# Patient Record
Sex: Female | Born: 1955 | Race: Black or African American | Hispanic: No | Marital: Married | Smoking: Never smoker
Health system: Southern US, Community
[De-identification: ages and names within clinical notes are randomized; demographics above are authoritative.]

## PROBLEM LIST (undated history)

## (undated) DIAGNOSIS — F419 Anxiety disorder, unspecified: Secondary | ICD-10-CM

## (undated) DIAGNOSIS — E785 Hyperlipidemia, unspecified: Secondary | ICD-10-CM

## (undated) DIAGNOSIS — N951 Menopausal and female climacteric states: Secondary | ICD-10-CM

## (undated) DIAGNOSIS — T8859XA Other complications of anesthesia, initial encounter: Secondary | ICD-10-CM

## (undated) DIAGNOSIS — I10 Essential (primary) hypertension: Secondary | ICD-10-CM

## (undated) DIAGNOSIS — K219 Gastro-esophageal reflux disease without esophagitis: Secondary | ICD-10-CM

## (undated) DIAGNOSIS — T4145XA Adverse effect of unspecified anesthetic, initial encounter: Secondary | ICD-10-CM

## (undated) DIAGNOSIS — M1990 Unspecified osteoarthritis, unspecified site: Secondary | ICD-10-CM

## (undated) DIAGNOSIS — Z87440 Personal history of urinary (tract) infections: Secondary | ICD-10-CM

## (undated) DIAGNOSIS — Z9889 Other specified postprocedural states: Secondary | ICD-10-CM

## (undated) DIAGNOSIS — E119 Type 2 diabetes mellitus without complications: Secondary | ICD-10-CM

## (undated) DIAGNOSIS — R112 Nausea with vomiting, unspecified: Secondary | ICD-10-CM

## (undated) HISTORY — PX: TUBAL LIGATION: SHX77

## (undated) HISTORY — PX: ROTATOR CUFF REPAIR: SHX139

## (undated) HISTORY — PX: TONSILLECTOMY: SUR1361

## (undated) HISTORY — DX: Anxiety disorder, unspecified: F41.9

## (undated) HISTORY — DX: Hyperlipidemia, unspecified: E78.5

## (undated) HISTORY — DX: Personal history of urinary (tract) infections: Z87.440

## (undated) HISTORY — PX: APPENDECTOMY: SHX54

## (undated) HISTORY — DX: Menopausal and female climacteric states: N95.1

## (undated) HISTORY — PX: CHOLECYSTECTOMY: SHX55

## (undated) HISTORY — DX: Type 2 diabetes mellitus without complications: E11.9

## (undated) HISTORY — DX: Essential (primary) hypertension: I10

## (undated) HISTORY — DX: Gastro-esophageal reflux disease without esophagitis: K21.9

---
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
## 2001-10-04 ENCOUNTER — Ambulatory Visit (HOSPITAL_COMMUNITY): Admission: RE | Admit: 2001-10-04 | Discharge: 2001-10-04 | Payer: Self-pay | Admitting: Obstetrics and Gynecology

## 2001-10-04 ENCOUNTER — Encounter: Payer: Self-pay | Admitting: Obstetrics and Gynecology

## 2001-12-02 ENCOUNTER — Emergency Department (HOSPITAL_COMMUNITY): Admission: EM | Admit: 2001-12-02 | Discharge: 2001-12-02 | Payer: Self-pay | Admitting: *Deleted

## 2001-12-02 ENCOUNTER — Encounter: Payer: Self-pay | Admitting: *Deleted

## 2002-01-25 ENCOUNTER — Encounter: Payer: Self-pay | Admitting: Urology

## 2002-01-25 ENCOUNTER — Ambulatory Visit (HOSPITAL_COMMUNITY): Admission: RE | Admit: 2002-01-25 | Discharge: 2002-01-25 | Payer: Self-pay | Admitting: Urology

## 2002-08-19 ENCOUNTER — Ambulatory Visit (HOSPITAL_COMMUNITY): Admission: RE | Admit: 2002-08-19 | Discharge: 2002-08-19 | Payer: Self-pay | Admitting: Internal Medicine

## 2002-08-19 ENCOUNTER — Encounter: Payer: Self-pay | Admitting: Internal Medicine

## 2002-10-18 ENCOUNTER — Observation Stay (HOSPITAL_COMMUNITY): Admission: RE | Admit: 2002-10-18 | Discharge: 2002-10-19 | Payer: Self-pay | Admitting: General Surgery

## 2002-11-01 ENCOUNTER — Ambulatory Visit (HOSPITAL_COMMUNITY): Admission: RE | Admit: 2002-11-01 | Discharge: 2002-11-01 | Payer: Self-pay | Admitting: Obstetrics and Gynecology

## 2002-11-01 ENCOUNTER — Encounter: Payer: Self-pay | Admitting: Obstetrics and Gynecology

## 2003-11-15 ENCOUNTER — Ambulatory Visit (HOSPITAL_COMMUNITY): Admission: RE | Admit: 2003-11-15 | Discharge: 2003-11-15 | Payer: Self-pay | Admitting: Internal Medicine

## 2004-08-14 ENCOUNTER — Ambulatory Visit (HOSPITAL_COMMUNITY): Admission: RE | Admit: 2004-08-14 | Discharge: 2004-08-14 | Payer: Self-pay | Admitting: Obstetrics and Gynecology

## 2004-11-22 ENCOUNTER — Ambulatory Visit (HOSPITAL_COMMUNITY): Admission: RE | Admit: 2004-11-22 | Discharge: 2004-11-22 | Payer: Self-pay | Admitting: Internal Medicine

## 2005-12-11 ENCOUNTER — Ambulatory Visit: Payer: Self-pay | Admitting: Internal Medicine

## 2005-12-11 ENCOUNTER — Ambulatory Visit (HOSPITAL_COMMUNITY): Admission: RE | Admit: 2005-12-11 | Discharge: 2005-12-11 | Payer: Self-pay | Admitting: Internal Medicine

## 2005-12-30 ENCOUNTER — Ambulatory Visit (HOSPITAL_COMMUNITY): Admission: RE | Admit: 2005-12-30 | Discharge: 2005-12-30 | Payer: Self-pay | Admitting: Obstetrics and Gynecology

## 2006-09-29 ENCOUNTER — Ambulatory Visit (HOSPITAL_COMMUNITY): Admission: RE | Admit: 2006-09-29 | Discharge: 2006-09-29 | Payer: Self-pay | Admitting: Internal Medicine

## 2007-01-08 ENCOUNTER — Ambulatory Visit (HOSPITAL_COMMUNITY): Admission: RE | Admit: 2007-01-08 | Discharge: 2007-01-08 | Payer: Self-pay | Admitting: Obstetrics and Gynecology

## 2007-08-23 ENCOUNTER — Other Ambulatory Visit: Admission: RE | Admit: 2007-08-23 | Discharge: 2007-08-23 | Payer: Self-pay | Admitting: Orthopaedic Surgery

## 2007-11-12 ENCOUNTER — Ambulatory Visit: Admission: RE | Admit: 2007-11-12 | Discharge: 2007-11-12 | Payer: Self-pay | Admitting: Internal Medicine

## 2007-11-25 ENCOUNTER — Ambulatory Visit: Payer: Self-pay | Admitting: Pulmonary Disease

## 2008-02-15 ENCOUNTER — Ambulatory Visit (HOSPITAL_COMMUNITY): Admission: RE | Admit: 2008-02-15 | Discharge: 2008-02-15 | Payer: Self-pay | Admitting: Obstetrics and Gynecology

## 2008-07-22 ENCOUNTER — Ambulatory Visit (HOSPITAL_COMMUNITY): Admission: RE | Admit: 2008-07-22 | Discharge: 2008-07-22 | Payer: Self-pay | Admitting: Internal Medicine

## 2008-08-31 ENCOUNTER — Other Ambulatory Visit: Admission: RE | Admit: 2008-08-31 | Discharge: 2008-08-31 | Payer: Self-pay | Admitting: Obstetrics and Gynecology

## 2009-03-14 ENCOUNTER — Ambulatory Visit (HOSPITAL_COMMUNITY): Admission: RE | Admit: 2009-03-14 | Discharge: 2009-03-14 | Payer: Self-pay | Admitting: Internal Medicine

## 2009-09-05 ENCOUNTER — Other Ambulatory Visit: Admission: RE | Admit: 2009-09-05 | Discharge: 2009-09-05 | Payer: Self-pay | Admitting: Obstetrics and Gynecology

## 2009-11-03 HISTORY — PX: FOOT SURGERY: SHX648

## 2010-03-29 ENCOUNTER — Ambulatory Visit (HOSPITAL_COMMUNITY): Admission: RE | Admit: 2010-03-29 | Discharge: 2010-03-29 | Payer: Self-pay | Admitting: Obstetrics and Gynecology

## 2010-09-16 ENCOUNTER — Other Ambulatory Visit: Admission: RE | Admit: 2010-09-16 | Discharge: 2010-09-16 | Payer: Self-pay | Admitting: Obstetrics and Gynecology

## 2011-03-18 NOTE — Procedures (Signed)
NAMEJOEE, IOVINE                 ACCOUNT NO.:  [id number]   MEDICAL RECORD NO.:  [id number]          PATIENT TYPE:  OUT   LOCATION:  SLEEP LAB                     FACILITY:  [facility]   PHYSICIAN:  Barbaraann Share, MD,FCCPDATE OF BIRTH:  11/10/54   DATE OF STUDY:  11/12/2007                            NOCTURNAL POLYSOMNOGRAM   REFERRING PHYSICIAN:  Kingsley Callander. Ouida Sills, MD   INDICATIONS:  Hypersomnia with sleep apnea.   EPWORTH SCORE:  Six.   SLEEP ARCHITECTURE:  The patient had total sleep time of 364 minutes  with mildly decreased slow wave sleep for age and mildly decreased REM.  Sleep onset latency was normal at 13 minutes and REM onset was fairly  rapid at 54 minutes.  Sleep efficiency was fairly good at 92%.   RESPIRATORY DATA:  The patient was found to have two obstructive  hypopneas and no apneas for an apnea/hypopnea index of 0.3 events per  hour.  Events were not positional but they did occur primarily during  REM.  The patient did have many more numbers of airflow reduction that  qualified for obstructive hypopnea by airflow standards, however, did  not meet the American Academy of sleep medicine standards for O2  desaturation.  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  Split night study was not done secondary to the very small  numbers of events.   OXYGEN DATA:  There was O2 desaturation as low as 92% with the patient's  obstructive events.   CARDIAC DATA:  No clinically significant arrhythmias were noted.   MOVEMENT/PARASOMNIA:  None.   IMPRESSION/RECOMMENDATIONS:  1. Small numbers of obstructive events which do not meet the      apnea/hypopnea index criteria for the obstructive sleep apnea      syndrome.  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  This is a pre sleep  apnea condition      which      can disrupt sleep and affect quality of life.  Treatment for this      can include weight loss alone if indicated, upper airway surgery,      oral appliance, and also CPAP.  Clinical correlation is suggested.      Barbaraann Share, MD,FCCP  Diplomate, American Board of Sleep  Medicine  Electronically Signed     [facility]/MEDQ  D:  11/25/2007 15:32:11  T:  11/26/2007 05:31:55  Job:  [id number]

## 2011-03-21 NOTE — Op Note (Signed)
   NAME:  Emily Reeves, Emily Reeves                           ACCOUNT NO.:  [id number]   MEDICAL RECORD NO.:  [id number]                   PATIENT TYPE:  AMB   LOCATION:  DAY                                  FACILITY:  [facility]   PHYSICIAN:  Jerolyn Shin C. Katrinka Blazing, M.D.                DATE OF BIRTH:  08-Jul-1956   DATE OF PROCEDURE:  10/18/2002  DATE OF DISCHARGE:                                 OPERATIVE REPORT   PREOPERATIVE DIAGNOSIS:  Chronic cholecystitis.   POSTOPERATIVE DIAGNOSIS:  Chronic cholecystitis.   PROCEDURE:  Laparoscopic cholecystectomy.   SURGEON:  Dirk Dress. Katrinka Blazing, M.D.   DESCRIPTION OF PROCEDURE:  Under general anesthesia, the patient's abdomen  was prepped and draped in a sterile field.  A supraumbilical incision was  made.  Veress needle was inserted uneventfully.  Abdomen was insufflated  with 2.5 L of CO2.  Using a Visiport guide, a 10 mm port was placed.  The  laparoscope was placed.  The gallbladder was visualized.  The patient was  positioned in reversed Trendelenburg position.  Under videoscopic guidance,  the 10 mm port and two 5 mm ports were placed in standard positions in the  right upper quadrant.  The gallbladder was grasped and positioned.  Chronic  adhesions to the gallbladder were taken down using electrocautery and blunt  dissection.  The cystic duct was identified and dissected back to the  gallbladder.  It was clipped with five clips and divided.  There were two  small cystic artery branches.  Each was clipped with three clips and  divided.  Using electrocautery, the gallbladder was separated from the  intrahepatic bed without difficulty.  It was grasped and retrieved intact.  Hemostasis in the bed was felt to be adequate.  There was no bleeding and no  bile leak.  The liver appeared unremarkable.  The stomach was normal.  There  were some adhesions in the lower abdomen in the midline.  CO2 was allowed to  escape from the abdomen.  The ports were removed.  The  incisions were closed  using 0 Dexon on the fascia of the larger incisions and staples on the skin.  The patient tolerated the procedure well.  She was awakened from anesthesia  uneventfully, transferred to a bed, and taken to the postanesthetic area.                                               Dirk Dress. Katrinka Blazing, M.D.    LCS/MEDQ  D:  10/18/2002  T:  10/18/2002  Job:  [id number]

## 2011-03-21 NOTE — Op Note (Signed)
NAMETRAVIS, PURK                 ACCOUNT NO.:  [id number]   MEDICAL RECORD NO.:  [id number]          PATIENT TYPE:  AMB   LOCATION:  DAY                           FACILITY:  [facility]   PHYSICIAN:  Lionel December, M.D.    DATE OF BIRTH:  06/24/1956   DATE OF PROCEDURE:  12/11/2005  DATE OF DISCHARGE:                                 OPERATIVE REPORT   PROCEDURE:  Colonoscopy.   INDICATIONS:  Emily Reeves is a 55 year old Afro-American female who is here for  screening colonoscopy. Family history is negative for colorectal carcinoma.  Procedure and risks were reviewed with the patient, and informed consent was  obtained.   MEDICINES FOR CONSCIOUS SEDATION:  Demerol 50 mg IV, Versed 6 mg IV.   FINDINGS:  Procedure performed in endoscopy suite. The patient's vital signs  and O2 saturation were monitored during the procedure and remained stable.  The patient was placed in left lateral position and rectal examination  performed. No abnormality noted on external or digital exam. Olympus  videoscope was placed in rectum and advanced under vision into sigmoid colon  and beyond. Somewhat redundant sigmoid colon, but I was able to pass the  scope into descending colon and beyond. Preparation was excellent. Scope was  passed to cecum which was identified by appendiceal stump and ileocecal  valve. Pictures taken for the record. As the scope was withdrawn, colonic  mucosa was once again carefully examined and was normal throughout. Rectal  mucosa similarly was normal. Scope was retroflexed to examine anorectal  junction which was unremarkable. Endoscope was straightened and withdrawn.  The patient tolerated the procedure well.   FINAL DIAGNOSIS:  Normal colonoscopy.   RECOMMENDATIONS:  1.  She will resume her usual diet and medications.  2.  Yearly Hemoccults. She may consider next screening exam in 10 years from      now.      Lionel December, M.D.  Electronically Signed     NR/MEDQ  D:   12/11/2005  T:  12/11/2005  Job:  [id number]   cc:   Tilda Burrow, M.D.  Fax: [phone]   Kingsley Callander. Ouida Sills, MD  Fax: [phone]

## 2011-03-21 NOTE — H&P (Signed)
   NAME:  Emily Reeves, Emily Reeves                           ACCOUNT NO.:  [id number]   MEDICAL RECORD NO.:  [id number]                   PATIENT TYPE:  AMB   LOCATION:  DAY                                  FACILITY:  [facility]   PHYSICIAN:  Jerolyn Shin C. Katrinka Blazing, M.D.                DATE OF BIRTH:  05-25-1956   DATE OF ADMISSION:  10/18/2002  DATE OF DISCHARGE:                                HISTORY & PHYSICAL   HISTORY OF PRESENT ILLNESS:  A 55 year old female with a two-month history  of recurrent abdominal pain.  Symptoms were made worse with meals.  She had  intolerance to greasy, fried, and spicy foods.  Ultrasound was negative x2,  but HIDA scan was positive with an ejection fraction of less than 20%.  This  was done in 2000.  She has continued midepigastric pain.  The pain radiates  into her back.  There has been no history to suggest biliary obstruction.  She is scheduled for laparoscopic cholecystectomy.   PAST MEDICAL HISTORY:  She has no other chronic medical illness.   MEDICATIONS:  1. Protonix 40 mg q.d.  2. Xanax 0.5 mg p.r.n. during her menses.   PAST SURGICAL HISTORY:  Appendectomy, C-section, and tubal ligation.   SOCIAL HISTORY:  She does not drink, smoke, or use drugs.  She is married  with three children and is employed.   FAMILY HISTORY:  Positive for hypertension, atherosclerotic heart disease,  congestive heart failure, irritable bowel syndrome, breast cancer, and lung  cancer.   REVIEW OF SYSTEMS:  Negative.   PHYSICAL EXAMINATION:  VITAL SIGNS:  Blood pressure 114/78, pulse 84,  respirations 20, weight 187 pounds.  HEENT:  Unremarkable.  NECK:  Supple without JVD or bruit.  CHEST:  Clear to auscultation.  CARDIAC:  Regular rate and rhythm without murmur, gallop, or rub.  ABDOMEN:  Epigastric and right upper quadrant tenderness, normal bowel  sounds, no masses.  EXTREMITIES:  No cyanosis, clubbing, or edema.  NEUROLOGIC:  Nonfocal.  No motor, sensory, or cerebellar  deficit.    IMPRESSION:  Chronic cholecystitis with severe biliary colic.   PLAN:  Laparoscopic cholecystectomy.                                               Dirk Dress. Katrinka Blazing, M.D.    LCS/MEDQ  D:  10/18/2002  T:  10/18/2002  Job:  [id number]

## 2011-03-26 ENCOUNTER — Other Ambulatory Visit: Payer: Self-pay | Admitting: Obstetrics and Gynecology

## 2011-03-26 DIAGNOSIS — Z139 Encounter for screening, unspecified: Secondary | ICD-10-CM

## 2011-04-03 ENCOUNTER — Ambulatory Visit (HOSPITAL_COMMUNITY)
Admission: RE | Admit: 2011-04-03 | Discharge: 2011-04-03 | Disposition: A | Discharge status: Home / Self Care | Payer: BC Managed Care – PPO | Source: Ambulatory Visit | Attending: Obstetrics and Gynecology | Admitting: Obstetrics and Gynecology

## 2011-04-03 DIAGNOSIS — Z139 Encounter for screening, unspecified: Secondary | ICD-10-CM

## 2011-04-03 DIAGNOSIS — Z1231 Encounter for screening mammogram for malignant neoplasm of breast: Secondary | ICD-10-CM | POA: Insufficient documentation

## 2011-09-24 ENCOUNTER — Other Ambulatory Visit: Payer: Self-pay | Admitting: Obstetrics and Gynecology

## 2011-09-24 ENCOUNTER — Other Ambulatory Visit (HOSPITAL_COMMUNITY)
Admission: RE | Admit: 2011-09-24 | Discharge: 2011-09-24 | Disposition: A | Discharge status: Home / Self Care | Payer: BC Managed Care – PPO | Source: Ambulatory Visit | Attending: Obstetrics and Gynecology | Admitting: Obstetrics and Gynecology

## 2011-09-24 DIAGNOSIS — Z01419 Encounter for gynecological examination (general) (routine) without abnormal findings: Secondary | ICD-10-CM | POA: Insufficient documentation

## 2012-01-01 ENCOUNTER — Telehealth (INDEPENDENT_AMBULATORY_CARE_PROVIDER_SITE_OTHER): Payer: Self-pay | Admitting: *Deleted

## 2012-01-01 DIAGNOSIS — K219 Gastro-esophageal reflux disease without esophagitis: Secondary | ICD-10-CM

## 2012-01-01 MED ORDER — ESOMEPRAZOLE MAGNESIUM 40 MG PO CPDR: 40.0000 mg | DELAYED_RELEASE_CAPSULE | Freq: Every day | ORAL | Status: DC

## 2012-01-01 NOTE — Telephone Encounter (Signed)
Patient call and said Dr. Karilyn Cota sent in a rx for Nexium 40 mg to [facility]. As the end of Dec., Emily Reeves's insurance no longer does business with them. She is needing a new rx called in or sent to [facility] [place]. Patient's return phone number is [phone].

## 2012-01-01 NOTE — Telephone Encounter (Signed)
Will send rx for [facility] to [facility]

## 2012-01-08 ENCOUNTER — Other Ambulatory Visit (INDEPENDENT_AMBULATORY_CARE_PROVIDER_SITE_OTHER): Payer: Self-pay | Admitting: *Deleted

## 2012-01-08 NOTE — Telephone Encounter (Signed)
Patient called office this morning and stated that she could not get her Nexium because a prior auth was needed. I called the [facility] did PA over the phone and per the Susie the representative Nexium 40 mg take 1 a day was approved for 24 months. The local [facility] was made aware as was the patient.

## 2012-01-28 ENCOUNTER — Encounter (INDEPENDENT_AMBULATORY_CARE_PROVIDER_SITE_OTHER): Payer: Self-pay

## 2012-03-09 ENCOUNTER — Other Ambulatory Visit (INDEPENDENT_AMBULATORY_CARE_PROVIDER_SITE_OTHER): Payer: Self-pay | Admitting: Internal Medicine

## 2012-04-13 ENCOUNTER — Other Ambulatory Visit (HOSPITAL_COMMUNITY): Payer: Self-pay | Admitting: Internal Medicine

## 2012-04-13 DIAGNOSIS — IMO0001 Reserved for inherently not codable concepts without codable children: Secondary | ICD-10-CM

## 2012-04-26 ENCOUNTER — Ambulatory Visit (HOSPITAL_COMMUNITY)
Admission: RE | Admit: 2012-04-26 | Discharge: 2012-04-26 | Disposition: A | Discharge status: Home / Self Care | Payer: BC Managed Care – PPO | Source: Ambulatory Visit | Attending: Internal Medicine | Admitting: Internal Medicine

## 2012-04-26 DIAGNOSIS — Z1231 Encounter for screening mammogram for malignant neoplasm of breast: Secondary | ICD-10-CM | POA: Insufficient documentation

## 2012-04-26 DIAGNOSIS — IMO0001 Reserved for inherently not codable concepts without codable children: Secondary | ICD-10-CM

## 2013-01-26 ENCOUNTER — Encounter: Payer: Self-pay | Admitting: *Deleted

## 2013-01-27 ENCOUNTER — Other Ambulatory Visit: Payer: Self-pay | Admitting: Obstetrics and Gynecology

## 2013-02-21 ENCOUNTER — Encounter: Payer: Self-pay | Admitting: Obstetrics and Gynecology

## 2013-02-21 ENCOUNTER — Ambulatory Visit (INDEPENDENT_AMBULATORY_CARE_PROVIDER_SITE_OTHER): Payer: BC Managed Care – PPO | Admitting: Obstetrics and Gynecology

## 2013-02-21 ENCOUNTER — Encounter: Payer: Self-pay | Admitting: *Deleted

## 2013-02-21 ENCOUNTER — Other Ambulatory Visit (HOSPITAL_COMMUNITY)
Admission: RE | Admit: 2013-02-21 | Discharge: 2013-02-21 | Disposition: A | Discharge status: Home / Self Care | Payer: BC Managed Care – PPO | Source: Ambulatory Visit | Attending: Obstetrics and Gynecology | Admitting: Obstetrics and Gynecology

## 2013-02-21 VITALS — BP 118/70 | Ht 69.5 in | Wt 213.1 lb

## 2013-02-21 DIAGNOSIS — Z01419 Encounter for gynecological examination (general) (routine) without abnormal findings: Secondary | ICD-10-CM

## 2013-02-21 DIAGNOSIS — Z780 Asymptomatic menopausal state: Secondary | ICD-10-CM | POA: Insufficient documentation

## 2013-02-21 DIAGNOSIS — Z1212 Encounter for screening for malignant neoplasm of rectum: Secondary | ICD-10-CM

## 2013-02-21 DIAGNOSIS — Z0000 Encounter for general adult medical examination without abnormal findings: Secondary | ICD-10-CM

## 2013-02-21 DIAGNOSIS — Z1151 Encounter for screening for human papillomavirus (HPV): Secondary | ICD-10-CM | POA: Insufficient documentation

## 2013-02-21 LAB — HEMOCCULT GUIAC POC 1CARD (OFFICE)

## 2013-02-21 NOTE — Patient Instructions (Signed)
Notify us if breast mass or discharge noted, topical hydrocortisone as needed

## 2013-02-21 NOTE — Progress Notes (Signed)
  Assessment:  Normal Gyn Exam, postmenopausal on no HT   Plan:  1. Mammogram annual 2. pap smear done, next pap due 2017 3. return annually or prn  Subjective:  Emily Reeves is a 57 y.o. female  No obstetric history on file. who presents for annual exam.  The patient has complaints today of breast itching, and bilateral symmetric soreness, no dischg or mass  The following portions of the patient's history were reviewed and updated as appropriate: allergies, current medications, past family history, past medical history, past social history, past surgical history and problem list.  Review of Systems Pertinent items are noted in HPI.  Objective:  BP 118/70  Ht 5' 9.5" (1.765 m)  Wt 213 lb 1.6 oz (96.662 kg)  BMI 31.03 kg/m2  BMI: Body mass index is 31.03 kg/(m^2). General Appearance: Alert, appropriate appearance for age. No acute distress HEENT: Grossly normal Neck / Thyroid:  Cardiovascular: RRR; normal S1, S2, no murmur Lungs: CTA bilaterally Back: No CVAT Breast Exam: No dimpling, nipple retraction or discharge. No masses or nodes., Normal breast tissue bilaterally and No masses or nodes.No dimpling, nipple retraction or discharge. Gastrointestinal: Soft, non-tender, no masses or organomegaly Pelvic Exam: External genitalia: normal general appearance Vaginal: normal mucosa without prolapse or lesions Cervix: normal appearance Adnexa: normal bimanual exam Uterus: retroverted Rectovaginal: normal rectal, no masses and no rectocele Lymphatic Exam: Non-palpable nodes in neck, clavicular, axillary, or inguinal regions Skin: no rash or abnormalities Neurologic: Normal gait and speech, no tremor  Psychiatric: Alert and oriented, appropriate affect.  Urinalysis:normal and Not done  Christin Bach. MD Pgr [phone] 11:08 AM

## 2013-03-11 ENCOUNTER — Other Ambulatory Visit: Payer: Self-pay | Admitting: *Deleted

## 2013-03-14 MED ORDER — ALPRAZOLAM 0.5 MG PO TABS: 0.5000 mg | ORAL_TABLET | ORAL | Status: DC | PRN

## 2013-06-28 ENCOUNTER — Other Ambulatory Visit: Payer: Self-pay | Admitting: Obstetrics and Gynecology

## 2013-06-28 DIAGNOSIS — Z139 Encounter for screening, unspecified: Secondary | ICD-10-CM

## 2013-07-11 ENCOUNTER — Ambulatory Visit (HOSPITAL_COMMUNITY)
Admission: RE | Admit: 2013-07-11 | Discharge: 2013-07-11 | Disposition: A | Discharge status: Home / Self Care | Payer: BC Managed Care – PPO | Source: Ambulatory Visit | Attending: Obstetrics and Gynecology | Admitting: Obstetrics and Gynecology

## 2013-07-11 DIAGNOSIS — Z139 Encounter for screening, unspecified: Secondary | ICD-10-CM

## 2013-07-11 DIAGNOSIS — Z1231 Encounter for screening mammogram for malignant neoplasm of breast: Secondary | ICD-10-CM | POA: Insufficient documentation

## 2013-10-22 ENCOUNTER — Other Ambulatory Visit: Payer: Self-pay | Admitting: Obstetrics and Gynecology

## 2013-11-07 ENCOUNTER — Other Ambulatory Visit: Payer: Self-pay | Admitting: Obstetrics and Gynecology

## 2013-11-07 NOTE — Telephone Encounter (Signed)
rx xanax 0.5 mg x 30 tabs, refil x 2

## 2014-05-04 ENCOUNTER — Other Ambulatory Visit: Payer: Self-pay | Admitting: Obstetrics and Gynecology

## 2014-05-04 DIAGNOSIS — Z1231 Encounter for screening mammogram for malignant neoplasm of breast: Secondary | ICD-10-CM

## 2014-05-10 ENCOUNTER — Other Ambulatory Visit: Payer: Self-pay | Admitting: Obstetrics and Gynecology

## 2014-05-10 ENCOUNTER — Ambulatory Visit (INDEPENDENT_AMBULATORY_CARE_PROVIDER_SITE_OTHER): Payer: BC Managed Care – PPO | Admitting: Obstetrics and Gynecology

## 2014-05-10 ENCOUNTER — Encounter: Payer: Self-pay | Admitting: Obstetrics and Gynecology

## 2014-05-10 VITALS — BP 120/70 | Ht 70.0 in | Wt 214.0 lb

## 2014-05-10 DIAGNOSIS — Z01419 Encounter for gynecological examination (general) (routine) without abnormal findings: Secondary | ICD-10-CM | POA: Insufficient documentation

## 2014-05-10 DIAGNOSIS — E669 Obesity, unspecified: Secondary | ICD-10-CM | POA: Insufficient documentation

## 2014-05-10 DIAGNOSIS — N889 Noninflammatory disorder of cervix uteri, unspecified: Secondary | ICD-10-CM | POA: Insufficient documentation

## 2014-05-10 DIAGNOSIS — N951 Menopausal and female climacteric states: Secondary | ICD-10-CM | POA: Insufficient documentation

## 2014-05-10 MED ORDER — CONJ ESTROGENS-BAZEDOXIFENE 0.45-20 MG PO TABS: 1.0000 | ORAL_TABLET | Freq: Every day | ORAL | Status: DC

## 2014-05-10 NOTE — Patient Instructions (Signed)
Sign up MyChart for results

## 2014-05-10 NOTE — Addendum Note (Signed)
Addended by: Farley Ly on: 05/10/2014 03:52 PM   Modules accepted: Orders

## 2014-05-10 NOTE — Progress Notes (Signed)
This chart was scribed by Ludger Nutting, Medical Scribe, for Dr. Mallory Shirk on 05/10/14 at 2:33 PM. This chart was reviewed by Dr. Mallory Shirk for accuracy.  Assessment:  Annual Gyn Exam Cervical biopsy    Plan:  1. pap smear done, next pap due 3 years 2. return in 3 months for cervical lesion follow up reexam 3    Annual mammogram advised Subjective:  Emily Reeves is a 58 y.o. female No obstetric history on file. who presents for annual exam. No LMP recorded. Patient is postmenopausal. The patient has complaints of menopausal symptoms today. Her last pap was 02/2013 and was negative.   The following portions of the patient's history were reviewed and updated as appropriate: allergies, current medications, past family history, past medical history, past social history, past surgical history and problem list.  Review of Systems Constitutional: negative Gastrointestinal: negative Genitourinary: negative  Objective:  BP 120/70  Ht 5\' 10"  (1.778 m)  Wt 214 lb (97.07 kg)  BMI 30.71 kg/m2   BMI: Body mass index is 30.71 kg/(m^2).  General Appearance: Alert, appropriate appearance for age. No acute distress HEENT: Grossly normal Neck / Thyroid:  Cardiovascular: RRR; normal S1, S2, no murmur Lungs: CTA bilaterally Back: No CVAT Breast Exam: No dimpling, nipple retraction or discharge. No masses or nodes., Normal to inspection, Normal breast tissue bilaterally and No masses or nodes.No dimpling, nipple retraction or discharge. Gastrointestinal: Soft, non-tender, no masses or organomegaly Pelvic Exam:  External genitalia: normal general appearance Vaginal: normal mucosa without prolapse or lesions, normal without tenderness, induration or masses and normal rugae Cervix: 1 cm x 1 cm erythematous protrusion from post lip of cervix probably representing nabothian cyst. Adnexa: normal bimanual exam Uterus: normal single, nontender Rectovaginal: normal rectal, no masses and guaiac negative  stool obtained  Lymphatic Exam: Non-palpable nodes in neck, clavicular, axillary, or inguinal regions Skin: no rash or abnormalities Neurologic: Normal gait and speech, no tremor  Psychiatric: Alert and oriented, appropriate affect.  Urinalysis:Not done  Cervical Biopsy Procedure Note:  Patient given informed consent, signed copy in the chart, time out was performed.  Placed in lithotomy position. Cervix viewed with speculum   Cervical biopsies obtained at posterior lip of cervix at 6 o'clock position. All specimens were labelled and sent to pathology.  Patient was given post procedure instructions. Will follow up pathology and manage accordingly.  Routine preventative health maintenance measures emphasized.   Mallory Shirk. MD Pgr [phone] 2:33 PM

## 2014-05-12 ENCOUNTER — Telehealth: Payer: Self-pay | Admitting: Obstetrics and Gynecology

## 2014-05-12 ENCOUNTER — Other Ambulatory Visit: Payer: Self-pay | Admitting: *Deleted

## 2014-05-12 DIAGNOSIS — Z01419 Encounter for gynecological examination (general) (routine) without abnormal findings: Secondary | ICD-10-CM

## 2014-05-12 NOTE — Telephone Encounter (Signed)
Left msg that biopsy was normal.

## 2014-07-14 ENCOUNTER — Ambulatory Visit (HOSPITAL_COMMUNITY): Payer: BC Managed Care – PPO

## 2014-07-28 ENCOUNTER — Ambulatory Visit (HOSPITAL_COMMUNITY)
Admission: RE | Admit: 2014-07-28 | Discharge: 2014-07-28 | Disposition: A | Discharge status: Home / Self Care | Payer: BC Managed Care – PPO | Source: Ambulatory Visit | Attending: Obstetrics and Gynecology | Admitting: Obstetrics and Gynecology

## 2014-07-28 DIAGNOSIS — Z1231 Encounter for screening mammogram for malignant neoplasm of breast: Secondary | ICD-10-CM | POA: Diagnosis present

## 2014-08-02 ENCOUNTER — Other Ambulatory Visit: Payer: Self-pay | Admitting: Obstetrics and Gynecology

## 2014-08-07 ENCOUNTER — Telehealth: Payer: Self-pay | Admitting: Obstetrics and Gynecology

## 2014-08-07 ENCOUNTER — Other Ambulatory Visit: Payer: Self-pay | Admitting: Obstetrics and Gynecology

## 2014-08-07 ENCOUNTER — Telehealth: Payer: Self-pay | Admitting: *Deleted

## 2014-08-07 MED ORDER — ALPRAZOLAM 0.5 MG PO TABS: ORAL_TABLET | ORAL | Status: DC

## 2014-08-07 MED ORDER — METRONIDAZOLE 500 MG PO TABS: 500.0000 mg | ORAL_TABLET | Freq: Two times a day (BID) | ORAL | Status: DC

## 2014-08-07 NOTE — Telephone Encounter (Signed)
Rx's sent to pharmacy.  

## 2014-08-07 NOTE — Telephone Encounter (Signed)
Ferg sent Rx's to pt's pharmacy.

## 2014-08-08 NOTE — Telephone Encounter (Signed)
Pt aware that Dr. Glo Herring sent Rx's to pharmacy.

## 2014-08-09 ENCOUNTER — Other Ambulatory Visit: Payer: Self-pay | Admitting: Obstetrics and Gynecology

## 2014-08-11 NOTE — Telephone Encounter (Signed)
Left msg that Rx is at pharmacy from earlier this week.

## 2014-08-17 ENCOUNTER — Ambulatory Visit: Payer: BC Managed Care – PPO | Admitting: Obstetrics and Gynecology

## 2014-08-18 ENCOUNTER — Ambulatory Visit: Payer: BC Managed Care – PPO | Admitting: Obstetrics and Gynecology

## 2014-08-21 ENCOUNTER — Ambulatory Visit (INDEPENDENT_AMBULATORY_CARE_PROVIDER_SITE_OTHER): Payer: BC Managed Care – PPO | Admitting: Obstetrics and Gynecology

## 2014-08-21 ENCOUNTER — Encounter: Payer: Self-pay | Admitting: Obstetrics and Gynecology

## 2014-08-21 VITALS — BP 130/74 | Ht 70.0 in | Wt 213.0 lb

## 2014-08-21 DIAGNOSIS — Z0189 Encounter for other specified special examinations: Secondary | ICD-10-CM

## 2014-08-21 DIAGNOSIS — N952 Postmenopausal atrophic vaginitis: Secondary | ICD-10-CM

## 2014-08-21 DIAGNOSIS — Z13220 Encounter for screening for lipoid disorders: Secondary | ICD-10-CM

## 2014-08-21 DIAGNOSIS — Z1329 Encounter for screening for other suspected endocrine disorder: Secondary | ICD-10-CM

## 2014-08-21 LAB — CBC
HEMATOCRIT: 37 % (ref 36.0–46.0)
Hemoglobin: 12.3 g/dL (ref 12.0–15.0)
MCH: 25.8 pg — ABNORMAL LOW (ref 26.0–34.0)
MCHC: 33.2 g/dL (ref 30.0–36.0)
MCV: 77.7 fL — ABNORMAL LOW (ref 78.0–100.0)
Platelets: 280 10*3/uL (ref 150–400)
RBC: 4.76 MIL/uL (ref 3.87–5.11)
RDW: 14.1 % (ref 11.5–15.5)
WBC: 7.3 10*3/uL (ref 4.0–10.5)

## 2014-08-21 LAB — LIPID PANEL
CHOLESTEROL: 178 mg/dL (ref 0–200)
HDL: 44 mg/dL (ref >39)
LDL Cholesterol: 115 mg/dL — ABNORMAL HIGH (ref 0–99)
Total CHOL/HDL Ratio: 4 Ratio
Triglycerides: 95 mg/dL (ref <150)
VLDL: 19 mg/dL (ref 0–40)

## 2014-08-21 LAB — COMPREHENSIVE METABOLIC PANEL
ALBUMIN: 4.1 g/dL (ref 3.5–5.2)
ALT: 30 U/L (ref 0–35)
AST: 28 U/L (ref 0–37)
Alkaline Phosphatase: 74 U/L (ref 39–117)
BUN: 12 mg/dL (ref 6–23)
CALCIUM: 9.8 mg/dL (ref 8.4–10.5)
CHLORIDE: 104 meq/L (ref 96–112)
CO2: 27 mEq/L (ref 19–32)
Creat: 0.88 mg/dL (ref 0.50–1.10)
Glucose, Bld: 116 mg/dL — ABNORMAL HIGH (ref 70–99)
POTASSIUM: 4.1 meq/L (ref 3.5–5.3)
Sodium: 141 mEq/L (ref 135–145)
TOTAL PROTEIN: 6.9 g/dL (ref 6.0–8.3)
Total Bilirubin: 0.4 mg/dL (ref 0.2–1.2)

## 2014-08-21 LAB — TSH: TSH: 1.645 u[IU]/mL (ref 0.350–4.500)

## 2014-08-21 MED ORDER — ESTROGENS, CONJUGATED 0.625 MG/GM VA CREA: 0.5000 g | TOPICAL_CREAM | Freq: Every day | VAGINAL | Status: DC

## 2014-08-21 NOTE — Progress Notes (Addendum)
Patient ID: RIAH KEHOE, female   DOB: 1955/11/04, 58 y.o.   MRN: [id number]   [facility] Visit  Patient name: NILAYA BOUIE MRN [id number]  Date of birth: 03-15-1956  CC & HPI:  ALEISHA PAONE is a 58 y.o. female presenting today for a follow-up exam after being diagnosed with a benign nabotihan cyst.  She has been complaining of increased vaginal dryness and itching.  She stopped taking Duavee after experiencing cramping and increased vaginal moisture.    ROS:  All systems have been reviewed and are negative unless otherwise specified in the HPI.   Pertinent History Reviewed:   Reviewed: Significant for  Medical         Past Medical History  Diagnosis Date   Anxiety    Perimenopausal vasomotor symptoms    History of UTI    Hypertension    GERD (gastroesophageal reflux disease)                               Surgical Hx:    Past Surgical History  Procedure Laterality Date   Cesarean section     Foot surgery  2011   Lap choley     Tubal ligation     Appendectomy     Esophagogastroduodenoscopy     Cholecystectomy     Tonsilectomy, adenoidectomy, bilateral myringotomy and tubes     Medications: Reviewed & Updated - see associated section                      Current outpatient prescriptions:ALPRAZolam (XANAX) 0.5 MG tablet, TAKE 1 TABLET BY MOUTH DAILY AS NEEDED FOR ANXIETY., Disp: 30 tablet, Rfl: 1;  diclofenac (CATAFLAM) 50 MG tablet, , Disp: , Rfl: ;  losartan-hydrochlorothiazide (HYZAAR) 50-12.5 MG per tablet, , Disp: , Rfl: ;  NEXIUM 40 MG capsule, TAKE ONE CAPSULE BY MOUTH EVERY DAY, Disp: 30 capsule, Rfl: 1 Conj Estrogens-Bazedoxifene (DUAVEE) 0.45-20 MG TABS, Take 1 tablet by mouth daily., Disp: 30 tablet, Rfl: 12   Social History: Reviewed -  reports that she has never smoked. She has never used smokeless tobacco.  Objective Findings:  Vitals: Blood pressure 130/74, height 5\' 10"  (1.778 m), weight 213 lb (96.616 kg).  Physical Examination:  General appearance - alert, well appearing, and in no distress, oriented to person, place, and time and normal appearing weight Pelvic -  VULVA: normal appearing vulva with no masses, tenderness or lesions,  VAGINA: normal appearing vagina with normal color and discharge, no lesions,  CERVIX: normal appearing cervix without discharge or lesions, atrophic, posterior lip has a 1 cm healed biopsy site UTERUS: uterus is normal size, shape, consistency and nontender, well-supported ADNEXA: normal adnexa in size, nontender and no masses   Assessment & Plan:   A:  1. Biopsy proven nabothian cyst 2/ vaginal dryness, s/p trial Duavee d/c'd 3. Annual labs ordered. Cbc/cmp/tsh/lipids P:  1. Premarin 0.5g pv 3x/wk  2 Pt to follow up by myChart prn.  This chart was scribed for Jonnie Kind, MD by Donato Schultz, ED Scribe. This patient was seen in Room 2 and the patient's care was started at 9:04 AM.  Reviewed and premarin VC to be ordered

## 2014-08-21 NOTE — Progress Notes (Signed)
Patient ID: Emily Reeves, female   DOB: May 16, 1956, 58 y.o.   MRN: [id number] Pt here today for follow up. Pt states that she has noticed that she has had more  Vaginal dryness and itching. Pt denies any other problems or concerns at this time. Pt would like to have routine labs drawn while here today.

## 2014-08-24 ENCOUNTER — Telehealth: Payer: Self-pay | Admitting: *Deleted

## 2014-08-24 NOTE — Telephone Encounter (Signed)
Message copied by Farley Ly on Thu Aug 24, 2014  2:48 PM ------      Message from: Jonnie Kind      Created: Thu Aug 24, 2014 10:11 AM       Good results ------

## 2014-08-28 ENCOUNTER — Telehealth: Payer: Self-pay | Admitting: Obstetrics and Gynecology

## 2014-08-28 NOTE — Telephone Encounter (Signed)
Pt aware of results 

## 2014-10-31 ENCOUNTER — Other Ambulatory Visit: Payer: Self-pay | Admitting: Obstetrics and Gynecology

## 2015-02-02 ENCOUNTER — Encounter: Payer: BLUE CROSS/BLUE SHIELD | Attending: Internal Medicine | Admitting: Nutrition

## 2015-02-02 ENCOUNTER — Encounter: Payer: Self-pay | Admitting: Nutrition

## 2015-02-02 VITALS — Ht 70.0 in | Wt 205.0 lb

## 2015-02-02 DIAGNOSIS — Z713 Dietary counseling and surveillance: Secondary | ICD-10-CM | POA: Diagnosis not present

## 2015-02-02 DIAGNOSIS — E669 Obesity, unspecified: Secondary | ICD-10-CM | POA: Diagnosis not present

## 2015-02-02 DIAGNOSIS — E118 Type 2 diabetes mellitus with unspecified complications: Secondary | ICD-10-CM | POA: Insufficient documentation

## 2015-02-02 DIAGNOSIS — Z6829 Body mass index (BMI) 29.0-29.9, adult: Secondary | ICD-10-CM | POA: Insufficient documentation

## 2015-02-02 DIAGNOSIS — IMO0002 Reserved for concepts with insufficient information to code with codable children: Secondary | ICD-10-CM

## 2015-02-02 DIAGNOSIS — E1165 Type 2 diabetes mellitus with hyperglycemia: Secondary | ICD-10-CM

## 2015-02-02 NOTE — Patient Instructions (Signed)
Plan: 1. Eat three balanced meals per day using the Plate Method. 2. Measure portions out. 3. Increase physical activity to 30-60 minutes of exercise most days of the week. 4. Increase fresh fruits and vegetables. 5. Drink 4-5 bottles of water per day. 6. Aim for blood sugar in AM less than 130 mg/dl.  7. Keep a food journal and blood sugar log and bring at next visit.

## 2015-02-02 NOTE — Progress Notes (Signed)
  Medical Nutrition Therapy:  Appt start time: 0800 end time:  0930.  Assessment:  Primary concerns today: Diabetes. LIves with husband and family. Just fouind out she is a diabetic in the last month or so. She does all the shopping and cooking. Most foods are baked and broiled and some fried.  Recently started walking for 20 mins most days of the week in the last month. Has lost 10 lbs since being diagnosed as diabetic. She has cut out regular sodas and occasionally has a diet soda. Increased water intake to about 3 bottles per day.  (16 oz))    Diet is high in fat, sodium and low in fresh fruits and vegetables and carbohydrates.   She is not on any medications at present. She is willing to work on diet and exercise to manage her diabetes right now before having to go on medication.  Preferred Learning Style:    Auditory  Visual  Hands on  Learning Readiness:   Ready  Change in progress  MEDICATIONS: See list   DIETARY INTAKE:   24-hr recall:  B ( AM): 1 Eggs and [place] bacon-3 slices,  Wheat thin bread 1 slice, coffee Snk ( AM): fruit OR nabs, water  L ( PM): Hot dog,bun 2 small chicken wings, 1/2c potato salad, water Snk ( PM): none OR usually has been eating a halo fruit OR Baked pita chips D ( PM): Salmon cakes, baked beans, biscuit, water Snk ( PM): Sometimes: popcorn or Sherbet  Beverages: water or crystal light  Usual physical activity: Walking during lunch break  Estimated energy needs: 1500 calories 170 g carbohydrates 112 g protein 42 g fat  Progress Towards Goal(s):  In progress.   Nutritional Diagnosis:  NB-1.1 Food and nutrition-related knowledge deficit As related to Diabetes.  As evidenced by A1C >7%..    Intervention:  Nutrition and diabetes education provided on diet meal planning, CHO Counting, portion control, My Plate, target ranges for blood sugars, signs/symptoms of hyper/hypoglcyemia and treatment of both and prevention of complications of DM  and benefits of exercise.  Plan: 1. Eat three balanced meals per day using the Plate Method. 2. Measure portions out. 3. Increase physical activity to 30-60 minutes of exercise most days of the week. 4. Increase fresh fruits and vegetables. 5. Drink 4-5 bottles of water per day. 6. Aim for blood sugar in AM less than 130 mg/dl.  7. Keep a food journal and blood sugar log and bring at next visit.  Teaching Method Utilized:  Visual Auditory Hands on  Handouts given during visit include:  The Plate Method  The Meal Plan Card  Barriers to learning/adherence to lifestyle change: none  Demonstrated degree of understanding via:  Teach Back   Monitoring/Evaluation:  Dietary intake, exercise, meal planning, SBG, and body weight in 1 month(s).

## 2015-02-14 ENCOUNTER — Ambulatory Visit: Payer: Self-pay | Admitting: Nutrition

## 2015-03-16 ENCOUNTER — Encounter: Payer: Self-pay | Admitting: Nutrition

## 2015-03-16 ENCOUNTER — Encounter: Payer: BLUE CROSS/BLUE SHIELD | Attending: Internal Medicine | Admitting: Nutrition

## 2015-03-16 VITALS — Ht 70.0 in | Wt 201.4 lb

## 2015-03-16 DIAGNOSIS — E118 Type 2 diabetes mellitus with unspecified complications: Secondary | ICD-10-CM | POA: Diagnosis not present

## 2015-03-16 DIAGNOSIS — Z713 Dietary counseling and surveillance: Secondary | ICD-10-CM | POA: Insufficient documentation

## 2015-03-16 NOTE — Progress Notes (Signed)
  Medical Nutrition Therapy:  Appt start time: 1430 end time:  5537.  Assessment:  Primary concerns today: Diabetes. Follow up. Lost 3 lbs.  Usually walking three times per week. Eating baked and broiled foods. Eating smaller portions and less processed foods. Testing bloods sugar 90-1110's usually in am. Has cut out sweet tea and soda.  Goes next week to Dr. Willey Blade to get A1C checked. BS log brought in reveals better blood sugars in the lower 100's when testing.  Diet is much improved. Still could benefit from more lower carb vegetables, and fresh fruit and whole grains.  Preferred Learning Style:    Auditory  Visual  Hands on  Learning Readiness:   Ready  Change in progress  MEDICATIONS: See list   DIETARY INTAKE:   24-hr recall:  B ( AM): Boiled egg, wheat toast, bacon or sausage OR Rasin bran cluster Snk ( AM): none L ( PM): chicken sandwich, water and fruit Snk ( PM): none  D ( PM): Meat, vegetables and starch.  Snk ( PM): Beverages: water   Usual physical activity: Walking  A few times per week.  Estimated energy needs: 1500 calories 170 g carbohydrates 112 g protein 42 g fat  Progress Towards Goal(s):  In progress.   Nutritional Diagnosis:  NB-1.1 Food and nutrition-related knowledge deficit As related to Diabetes.  As evidenced by A1C >7%..    Intervention:  Nutrition and diabetes education provided on diet meal planning, CHO Counting, portion control, My Plate, target ranges for blood sugars, signs/symptoms of hyper/hypoglcyemia and treatment of both and prevention of complications of DM and benefits of exercise. Meal planning for eating out.  Plan: Keep up the [place]!!  1. Eat three balanced meals per day using the Plate Method. 2. Measure portions out. 3. Increase physical activity to 30-60 minutes of exercise most days of the week. 4. Increase fresh fruits and vegetables. 5. Drink 4-5 bottles of water per day. 6. Aim for blood sugar in AM less  than 120 mg/dl.  7. Contnue to spot check blood sugars to see how they are doing with what you are eating. 8. Get A1C down to 6%. Call if you have any questions or need a follow up visit.  Teaching Method Utilized:  Visual Auditory Hands on  Handouts given during visit include:  The Plate Method  The Meal Plan Card  Barriers to learning/adherence to lifestyle change: none  Demonstrated degree of understanding via:  Teach Back   Monitoring/Evaluation:  Dietary intake, exercise, meal planning, SBG, and body weight in PRN.

## 2015-03-16 NOTE — Patient Instructions (Signed)
Plan: Keep up the [place]!!  1. Eat three balanced meals per day using the Plate Method. 2. Measure portions out. 3. Increase physical activity to 30-60 minutes of exercise most days of the week. 4. Increase fresh fruits and vegetables. 5. Drink 4-5 bottles of water per day. 6. Aim for blood sugar in AM less than 120 mg/dl.  7. Contnue to spot check blood sugars to see how they are doing with what you are eating. 8. Get A1C down to 6%. Call if you have any questions or need a follow up visit.

## 2015-05-22 ENCOUNTER — Encounter: Payer: Self-pay | Admitting: Obstetrics and Gynecology

## 2015-05-22 ENCOUNTER — Ambulatory Visit (INDEPENDENT_AMBULATORY_CARE_PROVIDER_SITE_OTHER): Payer: BLUE CROSS/BLUE SHIELD | Admitting: Obstetrics and Gynecology

## 2015-05-22 VITALS — BP 118/68 | Ht 70.0 in | Wt 194.0 lb

## 2015-05-22 DIAGNOSIS — Z0000 Encounter for general adult medical examination without abnormal findings: Secondary | ICD-10-CM | POA: Diagnosis not present

## 2015-05-22 DIAGNOSIS — Z01419 Encounter for gynecological examination (general) (routine) without abnormal findings: Secondary | ICD-10-CM

## 2015-05-22 NOTE — Progress Notes (Addendum)
Patient ID: Emily Reeves, female   DOB: 06/16/1956, 59 y.o.   MRN: [id number] Pt here today for annual exam. Pt's last pap was 02/21/13 and was normal with negative HPV. Pt denies any problems or concerns at this time.   Assessment:  Annual Gyn Exam   Plan:  1. pap smear nNOT done, next pap due 2017 2. return annually or prn 3    Annual mammogram advised Subjective:  Emily Reeves is a 59 y.o. female No obstetric history on file. who presents for annual exam. No LMP recorded. Patient is postmenopausal. The patient has complaints today of none  The following portions of the patient's history were reviewed and updated as appropriate: allergies, current medications, past family history, past medical history, past social history, past surgical history and problem list. Past Medical History  Diagnosis Date  . Anxiety   . Perimenopausal vasomotor symptoms   . History of UTI   . Hypertension   . GERD (gastroesophageal reflux disease)   . Diabetes mellitus without complication   . Hyperlipidemia     Past Surgical History  Procedure Laterality Date  . Cesarean section    . Foot surgery  2011  . Lap choley    . Tubal ligation    . Appendectomy    . Esophagogastroduodenoscopy    . Cholecystectomy    . Tonsilectomy, adenoidectomy, bilateral myringotomy and tubes       Current outpatient prescriptions:  .  ALPRAZolam (XANAX) 0.5 MG tablet, TAKE 1 TABLET BY MOUTH DAILY AS NEEDED FOR ANXIETY., Disp: 30 tablet, Rfl: 1 .  cholecalciferol (VITAMIN D) 1000 UNITS tablet, Take 1,000 Units by mouth daily., Disp: , Rfl:  .  diclofenac (CATAFLAM) 50 MG tablet, , Disp: , Rfl:  .  losartan-hydrochlorothiazide (HYZAAR) 50-12.5 MG per tablet, , Disp: , Rfl:  .  NEXIUM 40 MG capsule, TAKE ONE CAPSULE BY MOUTH EVERY DAY, Disp: 30 capsule, Rfl: 1 .  conjugated estrogens (PREMARIN) vaginal cream, Place 4.65 Applicatorfuls vaginally daily. (Patient not taking: Reported on 02/02/2015), Disp: 42.5 g, Rfl:  12  Review of Systems Constitutional: negative, intenitonal weight loss Gastrointestinal: negative Genitourinary: neg Hx cervical polyp bx'd off last yr  Objective:  BP 118/68 mmHg  Ht 5\' 10"  (1.778 m)  Wt 87.998 kg (194 lb)  BMI 27.84 kg/m2   BMI: Body mass index is 27.84 kg/(m^2).  General Appearance: Alert, appropriate appearance for age. No acute distress HEENT: Grossly normal Neck / Thyroid:  Cardiovascular: RRR; normal S1, S2, no murmur Lungs: CTA bilaterally Back: No CVAT Breast Exam: No dimpling, nipple retraction or discharge. No masses or nodes. and No masses or nodes.No dimpling, nipple retraction or discharge. Gastrointestinal: Soft, non-tender, no masses or organomegaly Pelvic Exam: Vulva and vagina appear normal. Bimanual exam reveals normal uterus and adnexa. Cervix: nabothian cysts Rectovaginal: normal rectal, no masses and guaiac negative stool obtained Lymphatic Exam: Non-palpable nodes in neck, clavicular, axillary, or inguinal regions Skin: no rash or abnormalities Neurologic: Normal gait and speech, no tremor  Psychiatric: Alert and oriented, appropriate affect.  Urinalysis:Not done  Mallory Shirk. MD Pgr [phone] 3:06 PM

## 2015-07-10 ENCOUNTER — Other Ambulatory Visit: Payer: Self-pay | Admitting: Obstetrics and Gynecology

## 2015-07-10 DIAGNOSIS — Z1231 Encounter for screening mammogram for malignant neoplasm of breast: Secondary | ICD-10-CM

## 2015-07-30 ENCOUNTER — Ambulatory Visit (HOSPITAL_COMMUNITY)
Admission: RE | Admit: 2015-07-30 | Discharge: 2015-07-30 | Disposition: A | Discharge status: Home / Self Care | Payer: BLUE CROSS/BLUE SHIELD | Source: Ambulatory Visit | Attending: Obstetrics and Gynecology | Admitting: Obstetrics and Gynecology

## 2015-07-30 DIAGNOSIS — Z1231 Encounter for screening mammogram for malignant neoplasm of breast: Secondary | ICD-10-CM | POA: Insufficient documentation

## 2015-08-14 ENCOUNTER — Other Ambulatory Visit: Payer: Self-pay | Admitting: Obstetrics and Gynecology

## 2015-08-23 NOTE — Telephone Encounter (Signed)
refil Alprazolam x 30 tabs , refil x 2, pt uses less than once daily

## 2015-08-24 ENCOUNTER — Telehealth: Payer: Self-pay | Admitting: *Deleted

## 2015-08-24 MED ORDER — ALPRAZOLAM 0.5 MG PO TABS: 0.5000 mg | ORAL_TABLET | Freq: Every day | ORAL | Status: DC | PRN

## 2015-08-24 NOTE — Telephone Encounter (Signed)
Pt called stating that her pharmacy still had not gotten the refill request back. I spoke with D.r Glo Herring and he gave a verbal order to reorder the medication . Rx was reordered and then faxed to her pharmacy.

## 2015-12-20 ENCOUNTER — Encounter (INDEPENDENT_AMBULATORY_CARE_PROVIDER_SITE_OTHER): Payer: Self-pay | Admitting: *Deleted

## 2015-12-24 ENCOUNTER — Ambulatory Visit: Payer: BLUE CROSS/BLUE SHIELD | Admitting: Obstetrics and Gynecology

## 2015-12-25 ENCOUNTER — Ambulatory Visit (INDEPENDENT_AMBULATORY_CARE_PROVIDER_SITE_OTHER): Payer: BLUE CROSS/BLUE SHIELD | Admitting: Obstetrics and Gynecology

## 2015-12-25 ENCOUNTER — Encounter: Payer: Self-pay | Admitting: Obstetrics and Gynecology

## 2015-12-25 VITALS — BP 140/90 | Ht 69.0 in | Wt 197.0 lb

## 2015-12-25 DIAGNOSIS — R159 Full incontinence of feces: Secondary | ICD-10-CM | POA: Insufficient documentation

## 2015-12-25 MED ORDER — ALPRAZOLAM 0.5 MG PO TABS: 0.5000 mg | ORAL_TABLET | Freq: Every day | ORAL | Status: DC | PRN

## 2015-12-25 NOTE — Progress Notes (Signed)
Patient ID: Emily Reeves, female   DOB: 07/08/1956, 60 y.o.   MRN: [id number]   [facility] Visit  Patient name: Emily Reeves MRN [id number]  Date of birth: 10-29-1956  CC & HPI:  [name] is a 60 y.o. female presenting today for anal incontinence x 1 +yr. Finds shes lost stool in panties after a normal bm. Doesn't realize it as it happens  ROS:  No prior vag dels  Pertinent History Reviewed:   Reviewed: Significant for postmeno on no HT. Medical         Past Medical History  Diagnosis Date  . Anxiety   . Perimenopausal vasomotor symptoms   . History of UTI   . Hypertension   . GERD (gastroesophageal reflux disease)   . Diabetes mellitus without complication ([place])   . Hyperlipidemia                               Surgical Hx:    Past Surgical History  Procedure Laterality Date  . Cesarean section    . Foot surgery  2011  . Lap choley    . Tubal ligation    . Appendectomy    . Esophagogastroduodenoscopy    . Cholecystectomy    . Tonsilectomy, adenoidectomy, bilateral myringotomy and tubes     Medications: Reviewed & Updated - see associated section                       Current outpatient prescriptions:  .  ALPRAZolam (XANAX) 0.5 MG tablet, Take 1 tablet (0.5 mg total) by mouth daily as needed for anxiety., Disp: 30 tablet, Rfl: 2 .  atorvastatin (LIPITOR) 10 MG tablet, Take 10 mg by mouth daily., Disp: , Rfl: 12 .  cholecalciferol (VITAMIN D) 1000 UNITS tablet, Take 1,000 Units by mouth daily., Disp: , Rfl:  .  diclofenac (CATAFLAM) 50 MG tablet, , Disp: , Rfl:  .  losartan-hydrochlorothiazide (HYZAAR) 50-12.5 MG per tablet, , Disp: , Rfl:  .  NEXIUM 40 MG capsule, TAKE ONE CAPSULE BY MOUTH EVERY DAY, Disp: 30 capsule, Rfl: 1   Social History: Reviewed -  reports that she has never smoked. She has never used smokeless tobacco.  Objective Findings:  Vitals: Blood pressure 140/90, height 5\' 9"  (1.753 m), weight 197 lb (89.359 kg).  Physical  Examination: General appearance - alert, well appearing, and in no distress, oriented to person, place, and time and normal appearing weight Mental status - alert, oriented to person, place, and time, normal mood, behavior, speech, dress, motor activity, and thought processes, anxious Abdomen - soft, nontender, nondistended, no masses or organomegaly Pelvic - VULVA: normal appearing vulva with no masses, tenderness or lesions, vulvar \ VagiNA: normal appearing vagina with normal color and discharge, no lesions, atrophic, PELVIC FLOOR EXAM: rectocele present small pouch just above a normal sphincter with good tone,  , CERVIX: normal appearing cervix without discharge or lesions, UTERUS: uterus is normal size, shape, consistency and nontender, ADNEXA: normal adnexa in size, nontender and no masses, RECTAL: normal rectal, no masses, rectovaginal exam confirms pelvic findings, guaiac negative stool obtained   Assessment & Plan:   A:  1. 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 Discussed 2. Rx Premarin vC hs x 2 wk then 3x/wk 3 return 4 wk, consider post repair, a very focused repair, being careful NOT to cause stricture, or  dyspareunia.

## 2015-12-25 NOTE — Progress Notes (Signed)
Patient ID: Emily Reeves, female   DOB: 1956/04/16, 60 y.o.   MRN: [id number] Pt here today for bowel leakage. Pt states that she has this problem for a while but it has finally gotten on her nerves.

## 2015-12-26 ENCOUNTER — Telehealth: Payer: Self-pay | Admitting: Obstetrics and Gynecology

## 2015-12-27 MED ORDER — ESTRADIOL 0.1 MG/GM VA CREA: 0.5000 | TOPICAL_CREAM | VAGINAL | Status: DC

## 2015-12-27 NOTE — Telephone Encounter (Signed)
Estrace Rx sent to [facility]

## 2016-01-22 ENCOUNTER — Ambulatory Visit: Payer: BLUE CROSS/BLUE SHIELD | Admitting: Obstetrics and Gynecology

## 2016-01-23 ENCOUNTER — Ambulatory Visit (INDEPENDENT_AMBULATORY_CARE_PROVIDER_SITE_OTHER): Payer: BLUE CROSS/BLUE SHIELD | Admitting: Obstetrics and Gynecology

## 2016-01-23 ENCOUNTER — Encounter: Payer: Self-pay | Admitting: Obstetrics and Gynecology

## 2016-01-23 VITALS — BP 130/80 | Ht 69.0 in | Wt 198.0 lb

## 2016-01-23 DIAGNOSIS — R159 Full incontinence of feces: Secondary | ICD-10-CM

## 2016-01-23 DIAGNOSIS — Z01419 Encounter for gynecological examination (general) (routine) without abnormal findings: Secondary | ICD-10-CM

## 2016-01-23 NOTE — Progress Notes (Signed)
Patient ID: Emily Reeves, female   DOB: 01-May-1956, 60 y.o.   MRN: [id number]   [facility] Visit  Patient name: Emily Reeves MRN [id number]  Date of birth: 1956-09-20  CC & HPI:  Emily Reeves is a 60 y.o. female presenting today to for f/u on small rectocele above the anal sphinchter and to discuss post repair. Pt reports she wishes to have the surgery on 04/22/16.   ROS:  10 Systems reviewed and all are negative for acute change except as noted in the HPI. Pertinent History Reviewed:   Reviewed: Significant for HTN, DM, cesarean section, tubal ligation, appendectomy.  Medical         Past Medical History  Diagnosis Date  . Anxiety   . Perimenopausal vasomotor symptoms   . History of UTI   . Hypertension   . GERD (gastroesophageal reflux disease)   . Diabetes mellitus without complication ([place])   . Hyperlipidemia                               Surgical Hx:    Past Surgical History  Procedure Laterality Date  . Cesarean section    . Foot surgery  2011  . Lap choley    . Tubal ligation    . Appendectomy    . Esophagogastroduodenoscopy    . Cholecystectomy    . Tonsilectomy, adenoidectomy, bilateral myringotomy and tubes     Medications: Reviewed & Updated - see associated section                       Current outpatient prescriptions:  .  ALPRAZolam (XANAX) 0.5 MG tablet, Take 1 tablet (0.5 mg total) by mouth daily as needed for anxiety., Disp: 30 tablet, Rfl: 2 .  cholecalciferol (VITAMIN D) 1000 UNITS tablet, Take 1,000 Units by mouth daily., Disp: , Rfl:  .  diclofenac (CATAFLAM) 50 MG tablet, , Disp: , Rfl:  .  estradiol (ESTRACE VAGINAL) 0.1 MG/GM vaginal cream, Place 0.5 Applicatorfuls vaginally 3 (three) times a week., Disp: 42.5 g, Rfl: 12 .  losartan-hydrochlorothiazide (HYZAAR) 100-25 MG tablet, Take 1 tablet by mouth daily., Disp: , Rfl: 4 .  NEXIUM 40 MG capsule, TAKE ONE CAPSULE BY MOUTH EVERY DAY, Disp: 30 capsule, Rfl: 1   Social History: Reviewed  -  reports that she has never smoked. She has never used smokeless tobacco.  Objective Findings:  Vitals: Blood pressure 130/80, height 5\' 9"  (1.753 m), weight 198 lb (89.812 kg).  Physical Examination: Presents for discussion only.   Assessment & Plan:   A:  1. small rectocele above the anal sphinchter  2. Atrophic vaginitis responding to premarin cream  P:  1. Pre-Op appointment 04/03/16.      By signing my name below, I, Terressa Koyanagi, attest that this documentation has been prepared under the direction and in the presence of Mallory Shirk, MD. Electronically Signed: Terressa Koyanagi, ED Scribe. 01/23/2016. 3:22 PM.   I personally performed the services described in this documentation, which was SCRIBED in my presence. The recorded information has been reviewed and considered accurate. It has been edited as necessary during review. Jonnie Kind, MD

## 2016-03-13 DIAGNOSIS — K219 Gastro-esophageal reflux disease without esophagitis: Secondary | ICD-10-CM | POA: Diagnosis not present

## 2016-03-13 DIAGNOSIS — E119 Type 2 diabetes mellitus without complications: Secondary | ICD-10-CM | POA: Diagnosis not present

## 2016-03-13 DIAGNOSIS — Z79899 Other long term (current) drug therapy: Secondary | ICD-10-CM | POA: Diagnosis not present

## 2016-03-13 DIAGNOSIS — I10 Essential (primary) hypertension: Secondary | ICD-10-CM | POA: Diagnosis not present

## 2016-03-13 DIAGNOSIS — E039 Hypothyroidism, unspecified: Secondary | ICD-10-CM | POA: Diagnosis not present

## 2016-03-20 DIAGNOSIS — E119 Type 2 diabetes mellitus without complications: Secondary | ICD-10-CM | POA: Diagnosis not present

## 2016-03-20 DIAGNOSIS — I10 Essential (primary) hypertension: Secondary | ICD-10-CM | POA: Diagnosis not present

## 2016-04-02 ENCOUNTER — Telehealth: Payer: Self-pay | Admitting: *Deleted

## 2016-04-02 NOTE — Telephone Encounter (Signed)
Opened telephone encounter in error.

## 2016-04-07 ENCOUNTER — Encounter: Payer: BLUE CROSS/BLUE SHIELD | Admitting: Obstetrics and Gynecology

## 2016-04-09 ENCOUNTER — Ambulatory Visit (INDEPENDENT_AMBULATORY_CARE_PROVIDER_SITE_OTHER): Payer: BLUE CROSS/BLUE SHIELD | Admitting: Obstetrics and Gynecology

## 2016-04-09 ENCOUNTER — Encounter: Payer: Self-pay | Admitting: Obstetrics and Gynecology

## 2016-04-09 VITALS — BP 100/60 | Ht 70.0 in

## 2016-04-09 DIAGNOSIS — N816 Rectocele: Secondary | ICD-10-CM | POA: Insufficient documentation

## 2016-04-09 NOTE — Progress Notes (Signed)
Patient ID: Emily Reeves, female   DOB: [birth year], 60 y.o.   MRN: [id number] Pt here today for pre op visit.

## 2016-04-09 NOTE — Patient Instructions (Signed)

## 2016-04-09 NOTE — Progress Notes (Signed)
Patient ID: Emily Reeves, female   DOB: 06-10-1956, 60 y.o.   MRN: [id number] Preoperative History and Physical  Emily Reeves is a 60 y.o. No obstetric history on file. here for surgical management of small rectocele. No significant preoperative concerns. Surgery is scheduled June 20. Pt reports she has incomplete evacuation.  Proposed surgery: posterior repair  Past Medical History  Diagnosis Date  . Anxiety   . Perimenopausal vasomotor symptoms   . History of UTI   . Hypertension   . GERD (gastroesophageal reflux disease)   . Diabetes mellitus without complication ([place])   . Hyperlipidemia    Past Surgical History  Procedure Laterality Date  . Cesarean section    . Foot surgery  2011  . Lap choley    . Tubal ligation    . Appendectomy    . Esophagogastroduodenoscopy    . Cholecystectomy    . Tonsilectomy, adenoidectomy, bilateral myringotomy and tubes     OB History  No data available  Patient denies any other pertinent gynecologic issues.   Current Outpatient Prescriptions on File Prior to Visit  Medication Sig Dispense Refill  . ALPRAZolam (XANAX) 0.5 MG tablet Take 1 tablet (0.5 mg total) by mouth daily as needed for anxiety. 30 tablet 2  . cholecalciferol (VITAMIN D) 1000 UNITS tablet Take 1,000 Units by mouth daily.    . diclofenac (CATAFLAM) 50 MG tablet     . estradiol (ESTRACE VAGINAL) 0.1 MG/GM vaginal cream Place 0.5 Applicatorfuls vaginally 3 (three) times a week. 42.5 g 12  . losartan-hydrochlorothiazide (HYZAAR) 100-25 MG tablet Take 1 tablet by mouth daily.  4  . NEXIUM 40 MG capsule TAKE ONE CAPSULE BY MOUTH EVERY DAY 30 capsule 1   No current facility-administered medications on file prior to visit.   No Known Allergies  Social History:   reports that she has never smoked. She has never used smokeless tobacco. She reports that she does not drink alcohol or use illicit drugs.  Family History  Problem Relation Age of Onset  . Cancer Mother     breast   . Heart disease Mother     angina  . Hypertension Mother   . Heart disease Father     heart attack  . Heart disease Maternal Grandfather     heart attack  . Diabetes Maternal Grandfather   . Hypertension Maternal Aunt   . Heart disease Maternal Aunt     Review of Systems: Noncontributory  PHYSICAL EXAM: Blood pressure 100/60, height 5\' 10"  (1.778 m). General appearance - alert, well appearing, and in no distress Chest - clear to auscultation, no wheezes, rales or rhonchi, symmetric air entry Heart - normal rate and regular rhythm Abdomen - soft, nontender, nondistended, no masses or organomegaly Pelvic exam: VULVA: normal appearing vulva with no masses, tenderness or lesions,  VAGINA: good support, normal appearing vagina with normal color and discharge, no lesions,  CERVIX: normal appearing cervix without discharge or lesions,  UTERUS: uterus is shape, consistency and nontender, retroverted uterus ADNEXA: normal adnexa in size, nontender and no masses,  Extremities - peripheral pulses normal, no pedal edema, no clubbing or cyanosis  Labs: No results found for this or any previous visit (from the past 336 hour(s)).  Imaging Studies: No results found.  Assessment: Discussed specifics of procedure with pt  Patient Active Problem List   Diagnosis Date Noted  . Anal sphincter incontinence silent 12/25/2015  . Encounter for routine gynecological examination 05/10/2014  . Menopausal symptoms  05/10/2014  . Cervical lesion 05/10/2014  . Obesity (BMI 30-39.9) 05/10/2014  . Menopause present 02/21/2013    Plan: Patient will undergo surgical management with posterior repair. (Rectocele repair)on 20 June at 7:30.  preop on 15 June 8 am  .mec 04/09/2016 3:15 PM  By signing my name below, I, Rowan Blase, attest that this documentation has been prepared under the direction and in the presence of Jonnie Kind, MD . Electronically Signed: Rowan Blase, Scribe.  04/09/2016. 3:15 PM.  I personally performed the services described in this documentation, which was SCRIBED in my presence. The recorded information has been reviewed and considered accurate. It has been edited as necessary during review. Jonnie Kind, MD   I personally performed the services described in this documentation, which was SCRIBED in my presence. The recorded information has been reviewed and considered accurate. It has been edited as necessary during review. Jonnie Kind, MD  .

## 2016-04-10 DIAGNOSIS — Z23 Encounter for immunization: Secondary | ICD-10-CM | POA: Diagnosis not present

## 2016-04-10 DIAGNOSIS — Z029 Encounter for administrative examinations, unspecified: Secondary | ICD-10-CM

## 2016-04-15 NOTE — Patient Instructions (Addendum)
Emily Reeves  04/15/2016     @PREFPERIOPPHARMACY @   Your procedure is scheduled on  04/22/2016   Report to [facility] at  615  A.M.  Call this number if you have problems the morning of surgery:  [phone]   Remember:  Do not eat food or drink liquids after midnight.  Take these medicines the morning of surgery with A SIP OF WATER  Xanax, nexium, losartan.   Do not wear jewelry, make-up or nail polish.  Do not wear lotions, powders, or perfumes.  You may wear deodorant.  Do not shave 48 hours prior to surgery.  Men may shave face and neck.  Do not bring valuables to the hospital.  [facility] is not responsible for any belongings or valuables.  Contacts, dentures or bridgework may not be worn into surgery.  Leave your suitcase in the car.  After surgery it may be brought to your room.  For patients admitted to the hospital, discharge time will be determined by your treatment team.  Patients discharged the day of surgery will not be allowed to drive home.   Name and phone number of your driver:   family Special instructions:  none  Please read over the following fact sheets that you were given. Coughing and Deep Breathing, Surgical Site Infection Prevention, Anesthesia Post-op Instructions and Care and Recovery After Surgery      About Rectocele  Overview  A rectocele is a type of hernia which causes different degrees of bulging of the rectal tissues into the vaginal wall.  You may even notice that it presses against the vaginal wall so much that some vaginal tissues droop outside of the opening of your vagina.  Causes of Rectocele  The most common cause is childbirth.  The muscles and ligaments in the pelvis that hold up and support the female organs and vagina become stretched and weakened during labor and delivery.  The more babies you have, the more the support tissues are stretched and weakened.  Not everyone who has a baby will develop a rectocele.  Some  women have stronger supporting tissue in the pelvis and may not have as much of a problem as others.  Women who have a Cesarean section usually do not get rectocele's unless they pushed a long time prior to the cesarean delivery.  Other conditions that can cause a rectocele include chronic constipation, a chronic cough, a lot of heavy lifting, and obesity.  Older women may have this problem because the loss of female hormones causes the vaginal tissue to become weaker.  Symptoms  There may not be any symptoms.  If you do have symptoms, they may include:  Pelvic pressure in the rectal area  Protrusion of the lower part of the vagina through the opening of the vagina  Constipation and trapping of the stool, making it difficult to have a bowel movement.  In severe cases, you may have to press on the lower part of your vagina to help push the stool out of you rectum.  This is called splinting to empty.  Diagnosing Rectocele  Your health care provider will ask about your symptoms and perform a pelvic exam.  S/he will ask you to bear down, pushing like you are having a bowel movement so as to see how far the lower part of the vagina protrudes into the vagina and possible outside of the vagina.  Your provider will also ask you to contract the  muscles of your pelvis (like you are stopping the stream in the middle of urinating) to determine the strength of your pelvic muscles.  Your provider may also do a rectal exam.  Treatment Options  If you do not have any symptoms, no treatment may be necessary.  Other treatment options include:  Pelvic floor exercises: Contracting the muscles in your genital area may help strengthen your muscles and support the organs.  Be sure to get proper exercise instruction from you physical therapist.  A pessary (removealbe pelvic support device) sometimes helps rectocele symptoms.  Surgery: Surgical repair may be necessary. In some cases the uterus may need to be taken  out ( a hysterectomy) as well.  There are many types of surgery for pelvic support problems.  Look for physicians who specialize in repair procedures.  You can take care of yourself by:  Treating and preventing constipation  Avoiding heavy lifting, and lifting correctly (with your legs, not with you waist or back)  Treating a chronic cough or bronchitis  Not smoking  avoiding too much weight gain  Doing pelvic floor exercises   2007, [employer] Doc.33PATIENT INSTRUCTIONS POST-ANESTHESIA  IMMEDIATELY FOLLOWING SURGERY:  Do not drive or operate machinery for the first twenty four hours after surgery.  Do not make any important decisions for twenty four hours after surgery or while taking narcotic pain medications or sedatives.  If you develop intractable nausea and vomiting or a severe headache please notify your doctor immediately.  FOLLOW-UP:  Please make an appointment with your surgeon as instructed. You do not need to follow up with anesthesia unless specifically instructed to do so.  WOUND CARE INSTRUCTIONS (if applicable):  Keep a dry clean dressing on the anesthesia/puncture wound site if there is drainage.  Once the wound has quit draining you may leave it open to air.  Generally you should leave the bandage intact for twenty four hours unless there is drainage.  If the epidural site drains for more than 36-48 hours please call the anesthesia department.  QUESTIONS?:  Please feel free to call your physician or the hospital operator if you have any questions, and they will be happy to assist you.      Full Liquid Diet A full liquid diet may be used:   To help you transition from a clear liquid diet to a soft diet.   When your body is healing and can only tolerate foods that are easy to digest.  Before or after certain a procedure, test, or surgery (such as stomach or intestinal surgeries).   If you have trouble swallowing or chewing.  A full liquid diet  includes fluids and foods that are liquid or will become liquid at room temperature. The full liquid diet gives you the proteins, fluids, salts, and minerals that you need for energy. If you continue this diet for more than 72 hours, talk to your health care provider about how many calories you need to consume. If you continue the diet for more than 5 days, talk to your health care provider about taking a multivitamin or a nutritional supplement. WHAT DO I NEED TO KNOW ABOUT A FULL LIQUID DIET?  You may have any liquid.  You may have any food that becomes a liquid at room temperature. The food is considered a liquid if it can be poured off a spoon at room temperature.  Drink one serving of citrus or vitamin C-enriched fruit juice daily. WHAT FOODS CAN I EAT? Grains Any grain  food that can be pureed in soup (such as crackers, pasta, and rice). Hot cereal (such as farina or oatmeal) that has been blended. Talk to your health care provider or dietitian about these foods. Vegetables Pulp-free tomato or vegetable juice. Vegetables pureed in soup.  Fruits Fruit juice, including nectars and juices with pulp. Meats and Other Protein Sources Eggs in custard, eggnog mix, and eggs used in ice cream or pudding. Strained meats, like in baby food, may be allowed. Consult your health care provider.  Dairy Milk and milk-based beverages, including milk shakes and instant breakfast mixes. Smooth yogurt. Pureed cottage cheese. Avoid these foods if they are not well tolerated. Beverages All beverages, including liquid nutritional supplements. Ask your health care provider if you can have carbonated beverages. They may not be well tolerated. Condiments Iodized salt, pepper, spices, and flavorings. Cocoa powder. Vinegar, ketchup, yellow mustard, smooth sauces (such as hollandaise, cheese sauce, or white sauce), and soy sauce. Sweets and Desserts Custard, smooth pudding. Flavored gelatin. Tapioca, junket. Plain  ice cream, sherbet, fruit ices. Frozen ice pops, frozen fudge pops, pudding pops, and other frozen bars with cream. Syrups, including chocolate syrup. Sugar, honey, jelly.  Fats and Oils Margarine, butter, cream, sour cream, and oils. Other Broth and cream soups. Strained, broth-based soups. The items listed above may not be a complete list of recommended foods or beverages. Contact your dietitian for more options.  WHAT FOODS CAN I NOT EAT? Grains All breads. Grains are not allowed unless they are pureed into soup. Vegetables Vegetables are not allowed unless they are juiced, or cooked and pureed into soup. Fruits Fruits are not allowed unless they are juiced. Meats and Other Protein Sources Any meat or fish. Cooked or raw eggs. Nut butters.  Dairy Cheese.  Condiments Stone ground mustards. Fats and Oils Fats that are coarse or chunky. Sweets and Desserts Ice cream or other frozen desserts that have any solids in them or on top, such as nuts, chocolate chips, and pieces of cookies. Cakes. Cookies. Candy. Others Soups with chunks or pieces in them. The items listed above may not be a complete list of foods and beverages to avoid. Contact your dietitian for more information.   This information is not intended to replace advice given to you by your health care provider. Make sure you discuss any questions you have with your health care provider.   Document Released: 10/20/2005 Document Revised: 10/25/2013 Document Reviewed: 08/25/2013 Elsevier Interactive Patient Education [employer].

## 2016-04-16 ENCOUNTER — Other Ambulatory Visit: Payer: Self-pay | Admitting: Obstetrics and Gynecology

## 2016-04-17 ENCOUNTER — Other Ambulatory Visit: Payer: Self-pay

## 2016-04-17 ENCOUNTER — Encounter (HOSPITAL_COMMUNITY)
Admission: RE | Admit: 2016-04-17 | Discharge: 2016-04-17 | Disposition: A | Discharge status: Home / Self Care | Payer: BLUE CROSS/BLUE SHIELD | Source: Ambulatory Visit | Attending: Obstetrics and Gynecology | Admitting: Obstetrics and Gynecology

## 2016-04-17 ENCOUNTER — Encounter (HOSPITAL_COMMUNITY): Payer: Self-pay

## 2016-04-17 DIAGNOSIS — I10 Essential (primary) hypertension: Secondary | ICD-10-CM | POA: Diagnosis not present

## 2016-04-17 DIAGNOSIS — N949 Unspecified condition associated with female genital organs and menstrual cycle: Secondary | ICD-10-CM | POA: Diagnosis not present

## 2016-04-17 DIAGNOSIS — Z9049 Acquired absence of other specified parts of digestive tract: Secondary | ICD-10-CM | POA: Insufficient documentation

## 2016-04-17 DIAGNOSIS — Z8249 Family history of ischemic heart disease and other diseases of the circulatory system: Secondary | ICD-10-CM | POA: Diagnosis not present

## 2016-04-17 DIAGNOSIS — Z833 Family history of diabetes mellitus: Secondary | ICD-10-CM | POA: Diagnosis not present

## 2016-04-17 DIAGNOSIS — Z9889 Other specified postprocedural states: Secondary | ICD-10-CM | POA: Diagnosis not present

## 2016-04-17 DIAGNOSIS — Z79899 Other long term (current) drug therapy: Secondary | ICD-10-CM | POA: Diagnosis not present

## 2016-04-17 DIAGNOSIS — Z780 Asymptomatic menopausal state: Secondary | ICD-10-CM | POA: Diagnosis not present

## 2016-04-17 DIAGNOSIS — Z01810 Encounter for preprocedural cardiovascular examination: Secondary | ICD-10-CM | POA: Insufficient documentation

## 2016-04-17 DIAGNOSIS — Z9851 Tubal ligation status: Secondary | ICD-10-CM | POA: Diagnosis not present

## 2016-04-17 DIAGNOSIS — Z01812 Encounter for preprocedural laboratory examination: Secondary | ICD-10-CM | POA: Diagnosis not present

## 2016-04-17 LAB — COMPREHENSIVE METABOLIC PANEL
ALBUMIN: 4 g/dL (ref 3.5–5.0)
ALT: 26 U/L (ref 14–54)
AST: 26 U/L (ref 15–41)
Alkaline Phosphatase: 82 U/L (ref 38–126)
Anion gap: 5 (ref 5–15)
BUN: 16 mg/dL (ref 6–20)
CHLORIDE: 103 mmol/L (ref 101–111)
CO2: 31 mmol/L (ref 22–32)
Calcium: 9.6 mg/dL (ref 8.9–10.3)
Creatinine, Ser: 0.78 mg/dL (ref 0.44–1.00)
GFR calc Af Amer: >60 mL/min (ref >60)
Glucose, Bld: 107 mg/dL — ABNORMAL HIGH (ref 65–99)
POTASSIUM: 3.3 mmol/L — AB (ref 3.5–5.1)
SODIUM: 139 mmol/L (ref 135–145)
Total Bilirubin: 0.5 mg/dL (ref 0.3–1.2)
Total Protein: 7.4 g/dL (ref 6.5–8.1)

## 2016-04-17 LAB — URINALYSIS, ROUTINE W REFLEX MICROSCOPIC
Bilirubin Urine: NEGATIVE
Glucose, UA: NEGATIVE mg/dL
Ketones, ur: NEGATIVE mg/dL
Nitrite: POSITIVE — AB
PH: 6 (ref 5.0–8.0)
Protein, ur: NEGATIVE mg/dL
SPECIFIC GRAVITY, URINE: 1.015 (ref 1.005–1.030)

## 2016-04-17 LAB — CBC
HCT: 34.4 % — ABNORMAL LOW (ref 36.0–46.0)
Hemoglobin: 11.3 g/dL — ABNORMAL LOW (ref 12.0–15.0)
MCH: 26.7 pg (ref 26.0–34.0)
MCHC: 32.8 g/dL (ref 30.0–36.0)
MCV: 81.1 fL (ref 78.0–100.0)
PLATELETS: 251 10*3/uL (ref 150–400)
RBC: 4.24 MIL/uL (ref 3.87–5.11)
RDW: 13.3 % (ref 11.5–15.5)
WBC: 7.1 10*3/uL (ref 4.0–10.5)

## 2016-04-17 LAB — URINE MICROSCOPIC-ADD ON

## 2016-04-21 NOTE — H&P (Addendum)
Progress Notes    Expand All Collapse All   Patient ID: Emily Reeves, female DOB: 1956-02-07, 60 y.o. MRN: [id number] Preoperative History and Physical  Emily Reeves is a 60 y.o. No obstetric history on file. here for surgical management of small rectocele. No significant preoperative concerns. Surgery is scheduled June 20. Pt reports she has incomplete evacuation.  Proposed surgery: posterior repair  Past Medical History  Diagnosis Date  . Anxiety   . Perimenopausal vasomotor symptoms   . History of UTI   . Hypertension   . GERD (gastroesophageal reflux disease)   . Diabetes mellitus without complication ([place])   . Hyperlipidemia    Past Surgical History  Procedure Laterality Date  . Cesarean section    . Foot surgery  2011  . Lap choley    . Tubal ligation    . Appendectomy    . Esophagogastroduodenoscopy    . Cholecystectomy    . Tonsilectomy, adenoidectomy, bilateral myringotomy and tubes     OB History  No data available  Patient denies any other pertinent gynecologic issues.   Current Outpatient Prescriptions on File Prior to Visit  Medication Sig Dispense Refill  . ALPRAZolam (XANAX) 0.5 MG tablet Take 1 tablet (0.5 mg total) by mouth daily as needed for anxiety. 30 tablet 2  . cholecalciferol (VITAMIN D) 1000 UNITS tablet Take 1,000 Units by mouth daily.    . diclofenac (CATAFLAM) 50 MG tablet     . estradiol (ESTRACE VAGINAL) 0.1 MG/GM vaginal cream Place 0.5 Applicatorfuls vaginally 3 (three) times a week. 42.5 g 12  . losartan-hydrochlorothiazide (HYZAAR) 100-25 MG tablet Take 1 tablet by mouth daily.  4  . NEXIUM 40 MG capsule TAKE ONE CAPSULE BY MOUTH EVERY DAY 30 capsule 1   No current facility-administered medications on file prior to visit.   No Known Allergies  Social History:  reports that she has never smoked. She has never used smokeless  tobacco. She reports that she does not drink alcohol or use illicit drugs.  Family History  Problem Relation Age of Onset  . Cancer Mother     breast  . Heart disease Mother     angina  . Hypertension Mother   . Heart disease Father     heart attack  . Heart disease Maternal Grandfather     heart attack  . Diabetes Maternal Grandfather   . Hypertension Maternal Aunt   . Heart disease Maternal Aunt     Review of Systems: Noncontributory  PHYSICAL EXAM: Blood pressure 100/60, height 5\' 10"  (1.778 m). General appearance - alert, well appearing, and in no distress Chest - clear to auscultation, no wheezes, rales or rhonchi, symmetric air entry Heart - normal rate and regular rhythm Abdomen - soft, nontender, nondistended, no masses or organomegaly Pelvic exam: VULVA: normal appearing vulva with no masses, tenderness or lesions,  VAGINA: good support, normal appearing vagina with normal color and discharge, no lesions,  CERVIX: normal appearing cervix without discharge or lesions,  UTERUS: uterus is shape, consistency and nontender, retroverted uterus ADNEXA: normal adnexa in size, nontender and no masses,  Rectal: small rectovaginal area of weakness allowing digital rotation to 90 degrees above anal sphincter, forming pouch. Anal sphincter intact.  Extremities - peripheral pulses normal, no pedal edema, no clubbing or cyanosis  Labs: No results found for this or any previous visit (from the past 336 hour(s)). CBC    Component Value Date/Time   WBC 7.1 04/17/2016 0845   RBC  4.24 04/17/2016 0845   HGB 11.3* 04/17/2016 0845   HCT 34.4* 04/17/2016 0845   PLT 251 04/17/2016 0845   MCV 81.1 04/17/2016 0845   MCH 26.7 04/17/2016 0845   MCHC 32.8 04/17/2016 0845   RDW 13.3 04/17/2016 0845   cmet CMP Latest Ref Rng 04/17/2016 08/21/2014  Glucose 65 - 99 mg/dL 107(H) 116(H)  BUN 6 - 20 mg/dL 16 12  Creatinine 0.44 - 1.00  mg/dL 0.78 0.88  Sodium 135 - 145 mmol/L 139 141  Potassium 3.5 - 5.1 mmol/L 3.3(L) 4.1  Chloride 101 - 111 mmol/L 103 104  CO2 22 - 32 mmol/L 31 27  Calcium 8.9 - 10.3 mg/dL 9.6 9.8  Total Protein 6.5 - 8.1 g/dL 7.4 6.9  Total Bilirubin 0.3 - 1.2 mg/dL 0.5 0.4  Alkaline Phos 38 - 126 U/L 82 74  AST 15 - 41 U/L 26 28  ALT 14 - 54 U/L 26 30      Imaging Studies:  Imaging Results    No results found.    Assessment: Discussed specifics of procedure with pt  Patient Active Problem List   Diagnosis Date Noted  . Anal sphincter incontinence silent 12/25/2015  . Encounter for routine gynecological examination 05/10/2014  . Menopausal symptoms 05/10/2014  . Cervical lesion 05/10/2014  . Obesity (BMI 30-39.9) 05/10/2014  . Menopause present 02/21/2013  .  Plan: Patient will undergo surgical management with posterior repair. (Rectocele repair)on 20 June at 7:30.  preop on 15 June 8 am  .mec 04/09/2016 3:15 PM  By signing my name below, I, Rowan Blase, attest that this documentation has been prepared under the direction and in the presence of Jonnie Kind, MD . Electronically Signed: Rowan Blase, Scribe. 04/09/2016. 3:15 PM.  I personally performed the services described in this documentation, which was SCRIBED in my presence. The recorded information has been reviewed and considered accurate. It has been edited as necessary during review. Jonnie Kind, MD  I personally performed the services described in this documentation, which was SCRIBED in my presence. The recorded information has been reviewed and considered accurate. It has been edited as necessary during review. Jonnie Kind, MD  .

## 2016-04-22 ENCOUNTER — Ambulatory Visit (HOSPITAL_COMMUNITY): Payer: BLUE CROSS/BLUE SHIELD | Admitting: Anesthesiology

## 2016-04-22 ENCOUNTER — Encounter (HOSPITAL_COMMUNITY)
Admission: RE | Disposition: A | Discharge status: Home / Self Care | Payer: Self-pay | Source: Ambulatory Visit | Attending: Obstetrics and Gynecology

## 2016-04-22 ENCOUNTER — Encounter (HOSPITAL_COMMUNITY): Payer: Self-pay | Admitting: *Deleted

## 2016-04-22 ENCOUNTER — Ambulatory Visit (HOSPITAL_COMMUNITY)
Admission: RE | Admit: 2016-04-22 | Discharge: 2016-04-23 | Disposition: A | Discharge status: Home / Self Care | Payer: BLUE CROSS/BLUE SHIELD | Source: Ambulatory Visit | Attending: Obstetrics and Gynecology | Admitting: Obstetrics and Gynecology

## 2016-04-22 DIAGNOSIS — N815 Vaginal enterocele: Secondary | ICD-10-CM | POA: Diagnosis present

## 2016-04-22 DIAGNOSIS — Z79899 Other long term (current) drug therapy: Secondary | ICD-10-CM | POA: Insufficient documentation

## 2016-04-22 DIAGNOSIS — N816 Rectocele: Secondary | ICD-10-CM | POA: Diagnosis present

## 2016-04-22 DIAGNOSIS — I10 Essential (primary) hypertension: Secondary | ICD-10-CM | POA: Diagnosis not present

## 2016-04-22 DIAGNOSIS — Z6830 Body mass index (BMI) 30.0-30.9, adult: Secondary | ICD-10-CM | POA: Insufficient documentation

## 2016-04-22 DIAGNOSIS — E785 Hyperlipidemia, unspecified: Secondary | ICD-10-CM | POA: Insufficient documentation

## 2016-04-22 DIAGNOSIS — E669 Obesity, unspecified: Secondary | ICD-10-CM | POA: Diagnosis not present

## 2016-04-22 DIAGNOSIS — Z7984 Long term (current) use of oral hypoglycemic drugs: Secondary | ICD-10-CM | POA: Diagnosis not present

## 2016-04-22 DIAGNOSIS — E119 Type 2 diabetes mellitus without complications: Secondary | ICD-10-CM | POA: Diagnosis not present

## 2016-04-22 DIAGNOSIS — F419 Anxiety disorder, unspecified: Secondary | ICD-10-CM | POA: Insufficient documentation

## 2016-04-22 DIAGNOSIS — K219 Gastro-esophageal reflux disease without esophagitis: Secondary | ICD-10-CM | POA: Insufficient documentation

## 2016-04-22 HISTORY — PX: RECTOCELE REPAIR: SHX761

## 2016-04-22 LAB — POCT I-STAT 4, (NA,K, GLUC, HGB,HCT)
Glucose, Bld: 114 mg/dL — ABNORMAL HIGH (ref 65–99)
HCT: 39 % (ref 36.0–46.0)
Hemoglobin: 13.3 g/dL (ref 12.0–15.0)
Potassium: 3.3 mmol/L — ABNORMAL LOW (ref 3.5–5.1)
Sodium: 145 mmol/L (ref 135–145)

## 2016-04-22 LAB — GLUCOSE, CAPILLARY: Glucose-Capillary: 109 mg/dL — ABNORMAL HIGH (ref 65–99)

## 2016-04-22 SURGERY — COLPORRHAPHY, POSTERIOR, FOR RECTOCELE REPAIR
Anesthesia: General

## 2016-04-22 MED ORDER — ROCURONIUM BROMIDE 100 MG/10ML IV SOLN
INTRAVENOUS | Status: DC | PRN
Start: 2016-04-22 — End: 2016-04-22
  Administered 2016-04-22: 5 mg via INTRAVENOUS
  Administered 2016-04-22: 30 mg via INTRAVENOUS

## 2016-04-22 MED ORDER — FENTANYL CITRATE (PF) 100 MCG/2ML IJ SOLN
INTRAMUSCULAR | Status: DC | PRN
  Administered 2016-04-22 (×2): 50 ug via INTRAVENOUS

## 2016-04-22 MED ORDER — BUPIVACAINE-EPINEPHRINE (PF) 0.5% -1:200000 IJ SOLN
INTRAMUSCULAR | Status: AC
  Filled 2016-04-22: qty 30

## 2016-04-22 MED ORDER — PROMETHAZINE HCL 25 MG/ML IJ SOLN
12.5000 mg | Freq: Once | INTRAMUSCULAR | Status: AC
  Administered 2016-04-22: 6.25 mg via INTRAVENOUS

## 2016-04-22 MED ORDER — NEOSTIGMINE METHYLSULFATE 10 MG/10ML IV SOLN
INTRAVENOUS | Status: DC | PRN
  Administered 2016-04-22: 1 mg via INTRAVENOUS
  Administered 2016-04-22 (×2): 2 mg via INTRAVENOUS

## 2016-04-22 MED ORDER — PANTOPRAZOLE SODIUM 40 MG PO TBEC
40.0000 mg | DELAYED_RELEASE_TABLET | Freq: Every day | ORAL | Status: DC
  Filled 2016-04-22 (×2): qty 1

## 2016-04-22 MED ORDER — ONDANSETRON HCL 4 MG/2ML IJ SOLN
4.0000 mg | Freq: Once | INTRAMUSCULAR | Status: AC
  Administered 2016-04-22: 4 mg via INTRAVENOUS
  Filled 2016-04-22: qty 2

## 2016-04-22 MED ORDER — PROMETHAZINE HCL 25 MG/ML IJ SOLN
INTRAMUSCULAR | Status: AC
  Filled 2016-04-22: qty 1

## 2016-04-22 MED ORDER — LIDOCAINE HCL (PF) 1 % IJ SOLN
INTRAMUSCULAR | Status: AC
  Filled 2016-04-22: qty 5

## 2016-04-22 MED ORDER — FENTANYL CITRATE (PF) 100 MCG/2ML IJ SOLN
25.0000 ug | INTRAMUSCULAR | Status: DC | PRN
  Administered 2016-04-22 (×4): 25 ug via INTRAVENOUS
  Filled 2016-04-22: qty 2

## 2016-04-22 MED ORDER — LIDOCAINE HCL 1 % IJ SOLN
INTRAMUSCULAR | Status: DC | PRN
Start: 2016-04-22 — End: 2016-04-22
  Administered 2016-04-22: 30 mg via INTRADERMAL

## 2016-04-22 MED ORDER — HYDROMORPHONE HCL 1 MG/ML IJ SOLN
1.0000 mg | INTRAMUSCULAR | Status: DC | PRN
  Administered 2016-04-22 – 2016-04-23 (×3): 1 mg via INTRAVENOUS
  Filled 2016-04-22 (×3): qty 1

## 2016-04-22 MED ORDER — SODIUM CHLORIDE 0.9 % IJ SOLN
INTRAMUSCULAR | Status: AC
  Filled 2016-04-22: qty 10

## 2016-04-22 MED ORDER — PROPOFOL 10 MG/ML IV BOLUS
INTRAVENOUS | Status: AC
  Filled 2016-04-22: qty 20

## 2016-04-22 MED ORDER — ONDANSETRON HCL 4 MG/2ML IJ SOLN
INTRAMUSCULAR | Status: AC
  Filled 2016-04-22: qty 2

## 2016-04-22 MED ORDER — PROMETHAZINE HCL 12.5 MG PO TABS: 25.0000 mg | ORAL_TABLET | Freq: Four times a day (QID) | ORAL | Status: DC | PRN

## 2016-04-22 MED ORDER — KETOROLAC TROMETHAMINE 30 MG/ML IJ SOLN
INTRAMUSCULAR | Status: AC
  Filled 2016-04-22: qty 1

## 2016-04-22 MED ORDER — OXYCODONE-ACETAMINOPHEN 5-325 MG PO TABS: 1.0000 | ORAL_TABLET | ORAL | Status: DC | PRN

## 2016-04-22 MED ORDER — SODIUM CHLORIDE 0.9 % IR SOLN
Status: DC | PRN
  Administered 2016-04-22: 1000 mL

## 2016-04-22 MED ORDER — LACTATED RINGERS IV SOLN
INTRAVENOUS | Status: DC
  Administered 2016-04-22: 1000 mL via INTRAVENOUS
  Administered 2016-04-22: 08:00:00 via INTRAVENOUS

## 2016-04-22 MED ORDER — GLYCOPYRROLATE 0.2 MG/ML IJ SOLN
INTRAMUSCULAR | Status: DC | PRN
  Administered 2016-04-22: 0.6 mg via INTRAVENOUS

## 2016-04-22 MED ORDER — PANTOPRAZOLE SODIUM 40 MG PO TBEC
40.0000 mg | DELAYED_RELEASE_TABLET | Freq: Every day | ORAL | Status: DC
Start: 2016-04-22 — End: 2016-04-23
  Administered 2016-04-22 – 2016-04-23 (×2): 40 mg via ORAL
  Filled 2016-04-22: qty 1

## 2016-04-22 MED ORDER — ONDANSETRON HCL 4 MG/2ML IJ SOLN
4.0000 mg | Freq: Once | INTRAMUSCULAR | Status: AC | PRN
  Administered 2016-04-22: 4 mg via INTRAVENOUS
  Filled 2016-04-22: qty 2

## 2016-04-22 MED ORDER — BUPIVACAINE-EPINEPHRINE 0.5% -1:200000 IJ SOLN
INTRAMUSCULAR | Status: DC | PRN
  Administered 2016-04-22: 10 mL
  Administered 2016-04-22: 8.5 mL

## 2016-04-22 MED ORDER — SUCCINYLCHOLINE CHLORIDE 20 MG/ML IJ SOLN
INTRAMUSCULAR | Status: AC
Start: 2016-04-22 — End: 2016-04-22
  Filled 2016-04-22: qty 1

## 2016-04-22 MED ORDER — KETOROLAC TROMETHAMINE 30 MG/ML IJ SOLN
30.0000 mg | Freq: Once | INTRAMUSCULAR | Status: AC
  Administered 2016-04-22: 30 mg via INTRAVENOUS

## 2016-04-22 MED ORDER — PROMETHAZINE HCL 25 MG/ML IJ SOLN: 12.5000 mg | INTRAMUSCULAR | Status: DC | PRN

## 2016-04-22 MED ORDER — SUCCINYLCHOLINE CHLORIDE 20 MG/ML IJ SOLN
INTRAMUSCULAR | Status: DC | PRN
  Administered 2016-04-22: 150 mg via INTRAVENOUS

## 2016-04-22 MED ORDER — LOSARTAN POTASSIUM-HCTZ 100-25 MG PO TABS: 1.0000 | ORAL_TABLET | Freq: Every day | ORAL | Status: DC

## 2016-04-22 MED ORDER — FENTANYL CITRATE (PF) 100 MCG/2ML IJ SOLN
INTRAMUSCULAR | Status: AC
  Filled 2016-04-22: qty 4

## 2016-04-22 MED ORDER — ONDANSETRON HCL 4 MG PO TABS: 4.0000 mg | ORAL_TABLET | Freq: Four times a day (QID) | ORAL | Status: DC | PRN

## 2016-04-22 MED ORDER — GLYCOPYRROLATE 0.2 MG/ML IJ SOLN
INTRAMUSCULAR | Status: AC
  Filled 2016-04-22: qty 1

## 2016-04-22 MED ORDER — ALBUTEROL SULFATE HFA 108 (90 BASE) MCG/ACT IN AERS
INHALATION_SPRAY | RESPIRATORY_TRACT | Status: AC
  Filled 2016-04-22: qty 6.7

## 2016-04-22 MED ORDER — IBUPROFEN 600 MG PO TABS: 600.0000 mg | ORAL_TABLET | Freq: Four times a day (QID) | ORAL | Status: DC | PRN

## 2016-04-22 MED ORDER — PROPOFOL 10 MG/ML IV BOLUS
INTRAVENOUS | Status: DC | PRN
  Administered 2016-04-22: 150 mg via INTRAVENOUS

## 2016-04-22 MED ORDER — EPHEDRINE SULFATE 50 MG/ML IJ SOLN
INTRAMUSCULAR | Status: AC
Start: 2016-04-22 — End: 2016-04-22
  Filled 2016-04-22: qty 1

## 2016-04-22 MED ORDER — LORATADINE 10 MG PO TABS: 10.0000 mg | ORAL_TABLET | Freq: Every day | ORAL | Status: DC | PRN

## 2016-04-22 MED ORDER — KETOROLAC TROMETHAMINE 30 MG/ML IJ SOLN
30.0000 mg | Freq: Four times a day (QID) | INTRAMUSCULAR | Status: DC
  Administered 2016-04-22 – 2016-04-23 (×4): 30 mg via INTRAVENOUS
  Filled 2016-04-22 (×4): qty 1

## 2016-04-22 MED ORDER — CEFAZOLIN SODIUM-DEXTROSE 2-4 GM/100ML-% IV SOLN
2.0000 g | INTRAVENOUS | Status: AC
  Administered 2016-04-22: 2 g via INTRAVENOUS
  Filled 2016-04-22: qty 100

## 2016-04-22 MED ORDER — ROCURONIUM BROMIDE 50 MG/5ML IV SOLN
INTRAVENOUS | Status: AC
  Filled 2016-04-22: qty 1

## 2016-04-22 MED ORDER — LOSARTAN POTASSIUM 50 MG PO TABS
100.0000 mg | ORAL_TABLET | Freq: Every day | ORAL | Status: DC
  Administered 2016-04-22 – 2016-04-23 (×2): 100 mg via ORAL
  Filled 2016-04-22 (×2): qty 2

## 2016-04-22 MED ORDER — SODIUM CHLORIDE 0.9 % IV SOLN
INTRAVENOUS | Status: DC
  Administered 2016-04-22: 12:00:00 via INTRAVENOUS

## 2016-04-22 MED ORDER — GLYCOPYRROLATE 0.2 MG/ML IJ SOLN
INTRAMUSCULAR | Status: AC
  Filled 2016-04-22: qty 3

## 2016-04-22 MED ORDER — ONDANSETRON HCL 4 MG/2ML IJ SOLN
4.0000 mg | Freq: Four times a day (QID) | INTRAMUSCULAR | Status: DC | PRN
  Administered 2016-04-23: 4 mg via INTRAVENOUS
  Filled 2016-04-22: qty 2

## 2016-04-22 MED ORDER — KETOROLAC TROMETHAMINE 30 MG/ML IJ SOLN: 30.0000 mg | Freq: Four times a day (QID) | INTRAMUSCULAR | Status: DC

## 2016-04-22 MED ORDER — MIDAZOLAM HCL 5 MG/5ML IJ SOLN
INTRAMUSCULAR | Status: DC | PRN
  Administered 2016-04-22: 2 mg via INTRAVENOUS

## 2016-04-22 MED ORDER — MIDAZOLAM HCL 2 MG/2ML IJ SOLN
1.0000 mg | INTRAMUSCULAR | Status: DC | PRN
  Administered 2016-04-22: 2 mg via INTRAVENOUS
  Filled 2016-04-22: qty 2

## 2016-04-22 MED ORDER — HYDROCHLOROTHIAZIDE 25 MG PO TABS
25.0000 mg | ORAL_TABLET | Freq: Every day | ORAL | Status: DC
  Administered 2016-04-22 – 2016-04-23 (×2): 25 mg via ORAL
  Filled 2016-04-22 (×2): qty 1

## 2016-04-22 MED ORDER — ONDANSETRON HCL 4 MG/2ML IJ SOLN
4.0000 mg | Freq: Once | INTRAMUSCULAR | Status: AC
  Administered 2016-04-22: 4 mg via INTRAVENOUS

## 2016-04-22 MED ORDER — MIDAZOLAM HCL 2 MG/2ML IJ SOLN
INTRAMUSCULAR | Status: AC
  Filled 2016-04-22: qty 2

## 2016-04-22 MED ORDER — ATORVASTATIN CALCIUM 10 MG PO TABS
10.0000 mg | ORAL_TABLET | Freq: Every day | ORAL | Status: DC
  Administered 2016-04-22 – 2016-04-23 (×2): 10 mg via ORAL
  Filled 2016-04-22 (×2): qty 1

## 2016-04-22 SURGICAL SUPPLY — 31 items
BAG HAMPER (MISCELLANEOUS) ×2 IMPLANT
CATH ROBINSON RED A/P 16FR (CATHETERS) ×2 IMPLANT
CLOTH BEACON ORANGE TIMEOUT ST (SAFETY) ×2 IMPLANT
COVER LIGHT HANDLE STERIS (MISCELLANEOUS) ×4 IMPLANT
DECANTER SPIKE VIAL GLASS SM (MISCELLANEOUS) ×2 IMPLANT
DRAPE PROXIMA HALF (DRAPES) ×2 IMPLANT
DRAPE STERI URO 9X17 APER PCH (DRAPES) ×2 IMPLANT
ELECT REM PT RETURN 9FT ADLT (ELECTROSURGICAL) ×2
ELECTRODE REM PT RTRN 9FT ADLT (ELECTROSURGICAL) ×1 IMPLANT
FORMALIN 10 PREFIL 480ML (MISCELLANEOUS) ×2 IMPLANT
GAUZE PACKING 2X5 YD STRL (GAUZE/BANDAGES/DRESSINGS) ×2 IMPLANT
GLOVE BIOGEL PI IND STRL 7.0 (GLOVE) ×2 IMPLANT
GLOVE BIOGEL PI IND STRL 9 (GLOVE) ×1 IMPLANT
GLOVE BIOGEL PI INDICATOR 7.0 (GLOVE) ×2
GLOVE BIOGEL PI INDICATOR 9 (GLOVE) ×1
GLOVE ECLIPSE 6.5 STRL STRAW (GLOVE) ×4 IMPLANT
GLOVE ECLIPSE 9.0 STRL (GLOVE) ×4 IMPLANT
GLOVE EXAM NITRILE MD LF STRL (GLOVE) ×2 IMPLANT
GOWN SPEC L3 XXLG W/TWL (GOWN DISPOSABLE) ×4 IMPLANT
GOWN STRL REUS W/TWL LRG LVL3 (GOWN DISPOSABLE) ×2 IMPLANT
KIT ROOM TURNOVER AP CYSTO (KITS) ×2 IMPLANT
MANIFOLD NEPTUNE II (INSTRUMENTS) ×2 IMPLANT
NEEDLE HYPO 25X1 1.5 SAFETY (NEEDLE) ×2 IMPLANT
NS IRRIG 1000ML POUR BTL (IV SOLUTION) ×2 IMPLANT
PACK PERI GYN (CUSTOM PROCEDURE TRAY) ×2 IMPLANT
PAD ARMBOARD 7.5X6 YLW CONV (MISCELLANEOUS) ×2 IMPLANT
SET BASIN LINEN APH (SET/KITS/TRAYS/PACK) ×2 IMPLANT
SUT CHROMIC 2 0 CT 1 (SUTURE) ×2 IMPLANT
SUT VIC AB 0 CT2 8-18 (SUTURE) IMPLANT
SYR CONTROL 10ML LL (SYRINGE) ×2 IMPLANT
TRAY FOLEY CATH SILVER 16FR (SET/KITS/TRAYS/PACK) IMPLANT

## 2016-04-22 NOTE — Progress Notes (Signed)
Day of Surgery Procedure(s) (LRB): POSTERIOR REPAIR (RECTOCELE) (N/A)  Subjective: Patient reports incisional pain, tolerating PO and no problems voiding.    Objective: I have reviewed patient's vital signs, intake and output and medications.  GI: soft, non-tender; bowel sounds normal; no masses,  no organomegaly Vaginal Bleeding: minimal  Assessment: s/p Procedure(s): POSTERIOR REPAIR (RECTOCELE) (N/A): stable and will d/c in a.m  Plan: Advance diet Discontinue IV fluids     Luis Nickles V 04/22/2016, 6:30 PM

## 2016-04-22 NOTE — Anesthesia Procedure Notes (Signed)
Procedure Name: Intubation Date/Time: 04/22/2016 7:41 AM Performed by: Charmaine Downs Pre-anesthesia Checklist: Patient identified, Emergency Drugs available, Suction available and Patient being monitored Patient Re-evaluated:Patient Re-evaluated prior to inductionOxygen Delivery Method: Circle system utilized Preoxygenation: Pre-oxygenation with 100% oxygen Intubation Type: IV induction, Rapid sequence and Cricoid Pressure applied Ventilation: Mask ventilation without difficulty Laryngoscope Size: Mac and 3 Grade View: Grade I Tube type: Oral Tube size: 7.0 mm Number of attempts: 1 Airway Equipment and Method: Stylet and Oral airway Placement Confirmation: positive ETCO2,  ETT inserted through vocal cords under direct vision and breath sounds checked- equal and bilateral Secured at: 22 cm Tube secured with: Tape Dental Injury: Teeth and Oropharynx as per pre-operative assessment

## 2016-04-22 NOTE — Anesthesia Postprocedure Evaluation (Signed)
Anesthesia Post Note  Patient: Emily Reeves  Procedure(s) Performed: Procedure(s) (LRB): POSTERIOR REPAIR (RECTOCELE) (N/A)  Patient location during evaluation: PACU Anesthesia Type: General Level of consciousness: awake and patient cooperative Pain management: pain level controlled Vital Signs Assessment: post-procedure vital signs reviewed and stable Respiratory status: spontaneous breathing, nonlabored ventilation and respiratory function stable Cardiovascular status: blood pressure returned to baseline and stable Postop Assessment: no signs of nausea or vomiting Anesthetic complications: no    Last Vitals:  Filed Vitals:   04/22/16 0720 04/22/16 0725  BP: 122/70 121/62  Pulse: 85 84  Temp:    Resp: 18 18    Last Pain: There were no vitals filed for this visit.               Vivi Piccirilli J

## 2016-04-22 NOTE — Interval H&P Note (Signed)
History and Physical Interval Note:  04/22/2016 6:45 AM  Emily Reeves  has presented today for surgery, with the diagnosis of rectocele  The various methods of treatment have been discussed with the patient and family. After consideration of risks, benefits and other options for treatment, the patient has consented to  Procedure(s): POSTERIOR REPAIR (RECTOCELE) (N/A) as a surgical intervention .  The patient's history has been reviewed, patient examined, no change in status, stable for surgery.  I have reviewed the patient's chart and labs.  Questions were answered to the patient's satisfaction.  Risks of compications such as infection, bleeding or incomplete resolution of defecatory difficulties discussed with the patient.    Jonnie Kind

## 2016-04-22 NOTE — Transfer of Care (Signed)
Immediate Anesthesia Transfer of Care Note  Patient: Emily Reeves  Procedure(s) Performed: Procedure(s): POSTERIOR REPAIR (RECTOCELE) (N/A)  Patient Location: PACU  Anesthesia Type:General  Level of Consciousness: awake and patient cooperative  Airway & Oxygen Therapy: Patient Spontanous Breathing and Patient connected to face mask oxygen  Post-op Assessment: Report given to RN, Post -op Vital signs reviewed and stable and Patient moving all extremities  Post vital signs: Reviewed and stable  Last Vitals:  Filed Vitals:   04/22/16 0720 04/22/16 0725  BP: 122/70 121/62  Pulse: 85 84  Temp:    Resp: 18 18    Last Pain: There were no vitals filed for this visit.    Patients Stated Pain Goal: 5 ([phone])  Complications: No apparent anesthesia complications

## 2016-04-22 NOTE — Anesthesia Preprocedure Evaluation (Signed)
Anesthesia Evaluation  Patient identified by MRN, date of birth, ID band Patient awake    Reviewed: Allergy & Precautions, NPO status , Patient's Chart, lab work & pertinent test results  Airway Mallampati: II  TM Distance: >3 FB Neck ROM: Full    Dental  (+) Teeth Intact   Pulmonary neg pulmonary ROS,    breath sounds clear to auscultation       Cardiovascular hypertension, Pt. on medications  Rhythm:Regular Rate:Normal     Neuro/Psych PSYCHIATRIC DISORDERS Anxiety    GI/Hepatic GERD  Medicated,  Endo/Other  diabetes, Type 2, Oral Hypoglycemic Agents  Renal/GU      Musculoskeletal   Abdominal   Peds  Hematology   Anesthesia Other Findings   Reproductive/Obstetrics                             Anesthesia Physical Anesthesia Plan  ASA: II  Anesthesia Plan: General   Post-op Pain Management:    Induction: Intravenous, Rapid sequence and Cricoid pressure planned  Airway Management Planned: Oral ETT  Additional Equipment:   Intra-op Plan:   Post-operative Plan: Extubation in OR  Informed Consent: I have reviewed the patients History and Physical, chart, labs and discussed the procedure including the risks, benefits and alternatives for the proposed anesthesia with the patient or authorized representative who has indicated his/her understanding and acceptance.     Plan Discussed with:   Anesthesia Plan Comments:         Anesthesia Quick Evaluation

## 2016-04-22 NOTE — Brief Op Note (Signed)
04/22/2016  9:30 AM  PATIENT:  Emily Reeves  60 y.o. female  PRE-OPERATIVE DIAGNOSIS:  rectocele  POST-OPERATIVE DIAGNOSIS:  rectocele  PROCEDURE:  Procedure(s): POSTERIOR REPAIR (RECTOCELE) (N/A)  SURGEON:  Surgeon(s) and Role:    * Jonnie Kind, MD - Primary  PHYSICIAN ASSISTANT:   ASSISTANTS: Ozzie Hoyle RNFA   ANESTHESIA:   local and general  EBL:  Total I/O In: 1400 [I.V.:1400] Out: 815 [Urine:800; Blood:15]  BLOOD ADMINISTERED:none  DRAINS: none   LOCAL MEDICATIONS USED:  MARCAINE    and Amount: 20 ml  SPECIMEN:  No Specimen  DISPOSITION OF SPECIMEN:  N/A  COUNTS:  YES  TOURNIQUET:  * No tourniquets in log *  DICTATION: .Dragon Dictation  PLAN OF CARE: Discharge to home after PACU  PATIENT DISPOSITION:  PACU - hemodynamically stable.   Delay start of Pharmacological VTE agent (>24hrs) due to surgical blood loss or risk of bleeding: not applicable Indications: Small defect just above anal sphincter resulting in incomplete evacuation. Details of procedure. Patient was taken operating room prepped and draped for vaginal procedure with antibiotics administered. The patient had previously had preoperative bowel prep. Timeout was conducted by surgical team and confirmed the procedure. The Allis clamps were placed at 5 and 7:00 on the hymen remnants, with the perineal body infiltrated with Marcaine solution just beneath the vaginal epithelium with the epithelium of the vagina split in the midline posteriorly for a distance of approximately 5 cm with sharp dissection laterally into the pararectal bilaterally with easy dissection of a surgical cleavage plane with a double gloved finger inserted in the rectum, we were able to use Allis clamps to identify some firm supportive tissue above the fingertip sized area of weakness just above the anal sphincter. This site-specific defect was slightly to the left of the midline, more of a transverse tear and Allis clamps And  palpation with the double gloved rectal finger could identify firm tissue further up the vaginal wall to be pulled down and attached to the fear your tissues of the defect. Sagittally placed suture of 0 Vicryl just to the left of the midline significantly improved ordered over the defect. I then was able to place a similar suture on the patient's right side and then a series of transverse horizontal mattress sutures pulling the perineal body over this defect resulting in improved tissue support that could be confirmed by the digital exam. A finger was maintained and placed here and the placement of the sutures and the sutures were tied down by my assistant. Hemostasis remained excellent throughout. The vaginal epithelium was closed over the defect repair by series of interrupted 2-0 chromic sutures, and then the vaginal packing of saline soaked gauze covered in K-Y jelly. The removed upon completion of the recovery room time. In and out catheterization of the bladder had removed 700 cc of urine Intra-Op. Patient to recovery room in stable condition sponge and needle counts correct

## 2016-04-22 NOTE — Progress Notes (Signed)
Removed vaginal packing per physician order.  Minimal bleeding so far.

## 2016-04-22 NOTE — Op Note (Signed)
04/22/2016  9:30 AM  PATIENT:  Emily Reeves  60 y.o. female  PRE-OPERATIVE DIAGNOSIS:  rectocele  POST-OPERATIVE DIAGNOSIS:  rectocele  PROCEDURE:  Procedure(s): POSTERIOR REPAIR (RECTOCELE) (N/A)  SURGEON:  Surgeon(s) and Role:    * Jonnie Kind, MD - Primary  PHYSICIAN ASSISTANT:   ASSISTANTS: Ozzie Hoyle RNFA   ANESTHESIA:   local and general  EBL:  Total I/O In: 1400 [I.V.:1400] Out: 815 [Urine:800; Blood:15]  BLOOD ADMINISTERED:none  DRAINS: none   LOCAL MEDICATIONS USED:  MARCAINE    and Amount: 20 ml  SPECIMEN:  No Specimen  DISPOSITION OF SPECIMEN:  N/A  COUNTS:  YES  TOURNIQUET:  * No tourniquets in log *  DICTATION: .Dragon Dictation  PLAN OF CARE: Discharge to home after PACU  PATIENT DISPOSITION:  PACU - hemodynamically stable.   Delay start of Pharmacological VTE agent (>24hrs) due to surgical blood loss or risk of bleeding: not applicable Indications: Small defect just above anal sphincter resulting in incomplete evacuation. Details of procedure. Patient was taken operating room prepped and draped for vaginal procedure with antibiotics administered. The patient had previously had preoperative bowel prep. Timeout was conducted by surgical team and confirmed the procedure. The Allis clamps were placed at 5 and 7:00 on the hymen remnants, with the perineal body infiltrated with Marcaine solution just beneath the vaginal epithelium with the epithelium of the vagina split in the midline posteriorly for a distance of approximately 5 cm with sharp dissection laterally into the pararectal bilaterally with easy dissection of a surgical cleavage plane with a double gloved finger inserted in the rectum, we were able to use Allis clamps to identify some firm supportive tissue above the fingertip sized area of weakness just above the anal sphincter. This site-specific defect was slightly to the left of the midline, more of a transverse tear and Allis clamps And  palpation with the double gloved rectal finger could identify firm tissue further up the vaginal wall to be pulled down and attached to the fear your tissues of the defect. Sagittally placed suture of 0 Vicryl just to the left of the midline significantly improved ordered over the defect. I then was able to place a similar suture on the patient's right side and then a series of transverse horizontal mattress sutures pulling the perineal body over this defect resulting in improved tissue support that could be confirmed by the digital exam. A finger was maintained and placed here and the placement of the sutures and the sutures were tied down by my assistant. Hemostasis remained excellent throughout. The vaginal epithelium was closed over the defect repair by series of interrupted 2-0 chromic sutures, and then the vaginal packing of saline soaked gauze covered in K-Y jelly. The removed upon completion of the recovery room time. In and out catheterization of the bladder had removed 700 cc of urine Intra-Op. Patient to recovery room in stable condition sponge and needle counts correct

## 2016-04-23 ENCOUNTER — Encounter (HOSPITAL_COMMUNITY): Payer: Self-pay | Admitting: Obstetrics and Gynecology

## 2016-04-23 DIAGNOSIS — I10 Essential (primary) hypertension: Secondary | ICD-10-CM | POA: Diagnosis not present

## 2016-04-23 DIAGNOSIS — N816 Rectocele: Secondary | ICD-10-CM | POA: Diagnosis not present

## 2016-04-23 DIAGNOSIS — E669 Obesity, unspecified: Secondary | ICD-10-CM | POA: Diagnosis not present

## 2016-04-23 DIAGNOSIS — E785 Hyperlipidemia, unspecified: Secondary | ICD-10-CM | POA: Diagnosis not present

## 2016-04-23 DIAGNOSIS — F419 Anxiety disorder, unspecified: Secondary | ICD-10-CM | POA: Diagnosis not present

## 2016-04-23 DIAGNOSIS — K219 Gastro-esophageal reflux disease without esophagitis: Secondary | ICD-10-CM | POA: Diagnosis not present

## 2016-04-23 DIAGNOSIS — E119 Type 2 diabetes mellitus without complications: Secondary | ICD-10-CM | POA: Diagnosis not present

## 2016-04-23 DIAGNOSIS — Z7984 Long term (current) use of oral hypoglycemic drugs: Secondary | ICD-10-CM | POA: Diagnosis not present

## 2016-04-23 DIAGNOSIS — Z79899 Other long term (current) drug therapy: Secondary | ICD-10-CM | POA: Diagnosis not present

## 2016-04-23 DIAGNOSIS — Z6830 Body mass index (BMI) 30.0-30.9, adult: Secondary | ICD-10-CM | POA: Diagnosis not present

## 2016-04-23 LAB — CBC
HCT: 32.4 % — ABNORMAL LOW (ref 36.0–46.0)
Hemoglobin: 10.5 g/dL — ABNORMAL LOW (ref 12.0–15.0)
MCH: 26.6 pg (ref 26.0–34.0)
MCHC: 32.4 g/dL (ref 30.0–36.0)
MCV: 82 fL (ref 78.0–100.0)
PLATELETS: 229 10*3/uL (ref 150–400)
RBC: 3.95 MIL/uL (ref 3.87–5.11)
RDW: 13.6 % (ref 11.5–15.5)
WBC: 10.7 10*3/uL — ABNORMAL HIGH (ref 4.0–10.5)

## 2016-04-23 LAB — BASIC METABOLIC PANEL
Anion gap: 5 (ref 5–15)
BUN: 11 mg/dL (ref 6–20)
CALCIUM: 8.5 mg/dL — AB (ref 8.9–10.3)
CO2: 30 mmol/L (ref 22–32)
CREATININE: 0.8 mg/dL (ref 0.44–1.00)
Chloride: 103 mmol/L (ref 101–111)
GFR calc Af Amer: >60 mL/min (ref >60)
Glucose, Bld: 103 mg/dL — ABNORMAL HIGH (ref 65–99)
Potassium: 3.3 mmol/L — ABNORMAL LOW (ref 3.5–5.1)
SODIUM: 138 mmol/L (ref 135–145)

## 2016-04-23 MED ORDER — OXYCODONE-ACETAMINOPHEN 5-325 MG PO TABS: 1.0000 | ORAL_TABLET | ORAL | Status: DC | PRN

## 2016-04-23 MED ORDER — POLYETHYLENE GLYCOL 3350 17 GM/SCOOP PO POWD: 17.0000 g | Freq: Every day | ORAL | Status: DC

## 2016-04-23 MED ORDER — PROMETHAZINE HCL 25 MG PO TABS: 25.0000 mg | ORAL_TABLET | Freq: Four times a day (QID) | ORAL | Status: DC | PRN

## 2016-04-23 NOTE — Care Management Note (Signed)
Case Management Note  Patient Details  Name: Emily Reeves MRN: [id number] Date of Birth: September 23, 1956  Subjective/Objective:     Patient is from home with husband. Reports being independent with ADL's. Has not had any HH or DME in the past. She drives herself to appointments. Her PCP is Dr. Willey Blade. She has [place] and has no issues obtaining medications.              Action/Plan: No CM needs. Will sign off.    Expected Discharge Date:       04/23/2016           Expected Discharge Plan:  Home/Self Care  In-House Referral:     Discharge planning Services  CM Consult  Post Acute Care Choice:  NA Choice offered to:  NA  DME Arranged:    DME Agency:     HH Arranged:    HH Agency:     Status of Service:  Completed, signed off  If discussed at [employer] of Stay Meetings, dates discussed:    Additional Comments:  Yoko Mcgahee, Chauncey Reading, RN 04/23/2016, 10:55 AM

## 2016-04-23 NOTE — Discharge Instructions (Signed)
Anterior and Posterior Colporrhaphy, Sling Procedure, Care After this does not exactly presents procedure U hand which was a rectocele repair at the recovery mechanisms are the same and the limitations are similar. Refer to this sheet in the next few weeks. These instructions provide you with information on caring for yourself after your procedure. Your health care provider may also give you more specific instructions. Your treatment has been planned according to current medical practices, but problems sometimes occur. Call your health care provider if you have any problems or questions after your procedure.  HOME CARE INSTRUCTIONS  Rest as much as possible during the first 2 weeks after the procedure.   Avoid heavy lifting (more than 10 pounds [4.5 kg]), pushing, or pulling. Limit stair climbing to once or twice a day the first week, then slowly increase this activity.   Avoid standing for prolonged periods of time.   Talk with your health care provider about when you may resume your usual physical activity.   You may resume your normal diet right away.   Drink at least 6-8 glasses of non-caffeinated beverages per day.   Eat a well-balanced diet. Daily portions of food from the meat (protein), milk, fruit, vegetable, and bread families are necessary for your health.   Your normal bowel function should return. If you become constipated, you may:   Take a mild laxative.  Add fruit and bran to your diet.  Drink more liquids.  You may take a shower and wash your hair.   Only take over-the-counter or prescription medicines as directed by your health care provider.   Clean the incision with water. Do not use a dressing unless the incision is draining or irritated. Check your incision daily for redness, draining, swelling, or separation of the skin.   Follow any bladder care instructions provided by your health care provider.   Keep your perineal area (the area between vagina  and rectum) clean and dry. Perform perineal care after every bowel movement and each time you urinate. You may take a sitz bath or sit in a tub of clean, warm water when necessary, unless your health care provider tells you otherwise.   Do not have sexual intercourse until permitted by your health care provider.   Follow up with your health care provider as directed.  SEEK MEDICAL CARE IF:  You have shaking chills.   Your pain is not relieved with medicine or becomes worse.  You have frequent or urgent urination, or you are unable to completely empty your bladder.   You feel a burning sensation when urinating.   You see pus coming from the wounds.  SEEK IMMEDIATE MEDICAL CARE IF:  You develop a fever.  You notice redness, drainage, swelling, or separation of the skin at the incision site.  You have difficulty breathing.  You are unable to urinate. MAKE SURE YOU:   Understand these instructions.  Will watch your condition.  Will get help right away if you are not doing well or get worse.   This information is not intended to replace advice given to you by your health care provider. Make sure you discuss any questions you have with your health care provider.   Document Released: 06/03/2004 Document Revised: 06/22/2013 Document Reviewed: 03/11/2013 Elsevier Interactive Patient Education [employer].

## 2016-04-23 NOTE — Anesthesia Postprocedure Evaluation (Signed)
Anesthesia Post Note  Patient: Emily Reeves  Procedure(s) Performed: Procedure(s) (LRB): POSTERIOR REPAIR (RECTOCELE) (N/A)  Patient location during evaluation: Nursing Unit Anesthesia Type: General Level of consciousness: awake and alert and oriented Pain management: pain level controlled Vital Signs Assessment: post-procedure vital signs reviewed and stable Respiratory status: spontaneous breathing Cardiovascular status: blood pressure returned to baseline Postop Assessment: adequate PO intake Anesthetic complications: no (Some intermittent nausea.  Spoke with nurse about making sure patient reeives a prescription for nausea prior to discharge.)    Last Vitals:  Filed Vitals:   04/22/16 2214 04/23/16 0559  BP: 101/54 129/53  Pulse: 70 76  Temp: 36.9 C 37 C  Resp: 20 18    Last Pain:  Filed Vitals:   04/23/16 0655  PainSc: Asleep                 Lyssa Hackley

## 2016-04-23 NOTE — Addendum Note (Signed)
Addendum  created 04/23/16 0940 by Ollen Bowl, CRNA   Modules edited: Clinical Notes   Clinical Notes:  File: [id number]

## 2016-04-23 NOTE — Discharge Summary (Signed)
Physician Discharge Summary  Patient ID: Emily Reeves MRN: [id number] DOB/AGE: 61/08/1956 61 y.o.  Admit date: 04/22/2016 Discharge date: 04/23/2016  Admission Diagnoses:Rectocele, anal incontinence  Discharge Diagnoses:  Active Problems:   Rectocele   Posterior vaginal hernia/rectocele   Discharged Condition: good  Hospital Course: Emily Reeves is a 60 y.o. No obstetric history on file. here for surgical management of small rectocele. No significant preoperative concerns. Surgery is scheduled June 20. Pt reports she has incomplete evacuation. She also complains of loss of control and postevacuation soiling of her clothing  Consults: None  Significant Diagnostic Studies: labs:  CBC Latest Ref Rng 04/23/2016 04/22/2016 04/17/2016  WBC 4.0 - 10.5 K/uL 10.7(H) - 7.1  Hemoglobin 12.0 - 15.0 g/dL 10.5(L) 13.3 11.3(L)  Hematocrit 36.0 - 46.0 % 32.4(L) 39.0 34.4(L)  Platelets 150 - 400 K/uL 229 - 251    BMP Latest Ref Rng 04/23/2016 04/22/2016 04/17/2016  Glucose 65 - 99 mg/dL 103(H) 114(H) 107(H)  BUN 6 - 20 mg/dL 11 - 16  Creatinine 0.44 - 1.00 mg/dL 0.80 - 0.78  Sodium 135 - 145 mmol/L 138 145 139  Potassium 3.5 - 5.1 mmol/L 3.3(L) 3.3(L) 3.3(L)  Chloride 101 - 111 mmol/L 103 - 103  CO2 22 - 32 mmol/L 30 - 31  Calcium 8.9 - 10.3 mg/dL 8.5(L) - 9.6      Treatments: surgery: Posterior repair  Discharge Exam: Blood pressure 129/53, pulse 76, temperature 98.6 F (37 C), temperature source Oral, resp. rate 18, height 5\' 10"  (1.778 m), weight 198 lb (89.812 kg), SpO2 99 %. General appearance: alert, cooperative and no distress Head: Normocephalic, without obvious abnormality, atraumatic, Admitted by patient to have a low pain tolerance GI: soft, non-tender; bowel sounds normal; no masses,  no organomegaly Pelvic: Patient notes light pink discharge at wiping, mild discomfort Extremities: extremities normal, atraumatic, no cyanosis or edema  Disposition:  discharge to home on stool  softeners and pain medicines    Medication List    TAKE these medications        ALPRAZolam 0.5 MG tablet  Commonly known as:  XANAX  Take 1 tablet (0.5 mg total) by mouth daily as needed for anxiety.     atorvastatin 10 MG tablet  Commonly known as:  LIPITOR  Take 10 mg by mouth daily.     cholecalciferol 1000 units tablet  Commonly known as:  VITAMIN D  Take 1,000 Units by mouth daily.     diclofenac 50 MG tablet  Commonly known as:  CATAFLAM     estradiol 0.1 MG/GM vaginal cream  Commonly known as:  ESTRACE VAGINAL  Place 0.5 Applicatorfuls vaginally 3 (three) times a week.     oxyCODONE-acetaminophen 5-325 MG tablet  Commonly known as:  PERCOCET/ROXICET  Take 1-2 tablets by mouth every 4 (four) hours as needed for severe pain (moderate to severe pain (when tolerating fluids)).     polyethylene glycol powder powder  Commonly known as:  MIRALAX  Take 17 g by mouth daily. To prevent constipation     VITAMIN B COMPLEX PO  Take 1 tablet by mouth daily.      ASK your doctor about these medications        loratadine 10 MG tablet  Commonly known as:  CLARITIN  Take 10 mg by mouth daily as needed for allergies.     losartan-hydrochlorothiazide 100-25 MG tablet  Commonly known as:  HYZAAR  Take 1 tablet by mouth daily.     NEXIUM  40 MG capsule  Generic drug:  esomeprazole  TAKE ONE CAPSULE BY MOUTH EVERY DAY           Follow-up Information    Follow up with Jonnie Kind, MD In 2 weeks.   Specialties:  Obstetrics and Gynecology, Radiology   Why:  Postoperative visit   Contact information:   [place] [address] [phone]       Signed: Jonnie Kind 04/23/2016, 7:35 AM

## 2016-04-23 NOTE — Progress Notes (Signed)
1008 Patient c/o dizziness and nausea, PRN medication given earlier for nausea, somewhat effective. Spoke with Dr.Ferguson who is calling in PRN nausea medication to patient's pharmacy and plan is for patient to d/c home today. Vaginal bleeding WDL, Vss, patient denies any pain at this time.

## 2016-04-25 ENCOUNTER — Telehealth: Payer: Self-pay | Admitting: Obstetrics and Gynecology

## 2016-04-30 ENCOUNTER — Other Ambulatory Visit: Payer: Self-pay | Admitting: Obstetrics and Gynecology

## 2016-04-30 DIAGNOSIS — N816 Rectocele: Secondary | ICD-10-CM

## 2016-04-30 MED ORDER — METRONIDAZOLE 0.75 % VA GEL: 1.0000 | Freq: Every day | VAGINAL | Status: DC

## 2016-04-30 NOTE — Progress Notes (Signed)
Pt aware of Rx being sent to her pharmacy.  

## 2016-05-05 ENCOUNTER — Other Ambulatory Visit: Payer: Self-pay | Admitting: Obstetrics and Gynecology

## 2016-05-05 ENCOUNTER — Encounter: Payer: BLUE CROSS/BLUE SHIELD | Admitting: Obstetrics and Gynecology

## 2016-05-07 ENCOUNTER — Ambulatory Visit (INDEPENDENT_AMBULATORY_CARE_PROVIDER_SITE_OTHER): Payer: BLUE CROSS/BLUE SHIELD | Admitting: Obstetrics and Gynecology

## 2016-05-07 ENCOUNTER — Encounter: Payer: Self-pay | Admitting: Obstetrics and Gynecology

## 2016-05-07 VITALS — BP 110/70 | HR 76 | Ht 70.0 in | Wt 196.6 lb

## 2016-05-07 DIAGNOSIS — N815 Vaginal enterocele: Secondary | ICD-10-CM

## 2016-05-07 DIAGNOSIS — Z09 Encounter for follow-up examination after completed treatment for conditions other than malignant neoplasm: Secondary | ICD-10-CM

## 2016-05-07 NOTE — Progress Notes (Signed)
Patient ID: ZOEI BUSHA, female   DOB: 1956/03/22, 60 y.o.   MRN: [id number]    Subjective:  Emily Reeves is a 60 y.o. female now 4 weeks status post posterior colporrhaphy.    Patient reports doing well bowel movements have  become easier Review of Systems Negative except    Diet:   Regular   Bowel movements : Improved.  The patient is not having any pain.  Objective:  BP 110/70 mmHg  Pulse 76  Ht 5\' 10"  (1.778 m)  Wt 196 lb 9.6 oz (89.177 kg)  BMI 28.21 kg/m2 General:Well developed, well nourished.  No acute distress. Abdomen: Bowel sounds normal, soft, non-tender. Pelvic Exam:    External Genitalia:  Normal.    Vagina: Normal    Cervix: Normal    Uterus: Normal    Adnexa/Bimanual: Normal Digital rectal sugars improved anterior support Incision(s):   Healing well, no drainage, no erythema, no hernia, no swelling, no dehiscence,     Assessment:  Post-Op 2 weeks s/p posterior colporrhaphy     Doing well postoperatively.   Plan:  1.Wound care discussed  rtw 4 wk 18 july 2. . current medications.Stool softener 3. Activity restrictions: No sexual activity until at least 6 weeks 4. return to work: 4 weeks. 5. Follow up in When necessary .

## 2016-05-12 ENCOUNTER — Other Ambulatory Visit: Payer: Self-pay | Admitting: *Deleted

## 2016-05-12 MED ORDER — ESTRADIOL 0.1 MG/GM VA CREA: TOPICAL_CREAM | VAGINAL | Status: DC

## 2016-05-20 ENCOUNTER — Telehealth: Payer: Self-pay | Admitting: Obstetrics and Gynecology

## 2016-05-20 NOTE — Telephone Encounter (Signed)
Pt called stating that she needs a note faxed to her Job stating that it is ok for her to return to work today, Pt had surgery. Spoke with patient and let know that Dr. Glo Herring is not in the office today and she would like to know if another provider in the office could write the letter and fax @ [phone]

## 2016-05-20 NOTE — Telephone Encounter (Signed)
Per Dr. Glo Herring ok to send note for pt to return to work.

## 2016-05-22 ENCOUNTER — Telehealth: Payer: Self-pay | Admitting: *Deleted

## 2016-05-22 NOTE — Telephone Encounter (Signed)
Note completed and sent to front office to be faxed.

## 2016-07-03 ENCOUNTER — Other Ambulatory Visit (HOSPITAL_COMMUNITY): Payer: Self-pay | Admitting: Internal Medicine

## 2016-07-03 DIAGNOSIS — Z1231 Encounter for screening mammogram for malignant neoplasm of breast: Secondary | ICD-10-CM

## 2016-07-19 DIAGNOSIS — E119 Type 2 diabetes mellitus without complications: Secondary | ICD-10-CM | POA: Diagnosis not present

## 2016-07-22 DIAGNOSIS — E119 Type 2 diabetes mellitus without complications: Secondary | ICD-10-CM | POA: Diagnosis not present

## 2016-07-22 DIAGNOSIS — Z23 Encounter for immunization: Secondary | ICD-10-CM | POA: Diagnosis not present

## 2016-07-22 DIAGNOSIS — I10 Essential (primary) hypertension: Secondary | ICD-10-CM | POA: Diagnosis not present

## 2016-08-04 ENCOUNTER — Ambulatory Visit (HOSPITAL_COMMUNITY)
Admission: RE | Admit: 2016-08-04 | Discharge: 2016-08-04 | Disposition: A | Discharge status: Home / Self Care | Payer: BLUE CROSS/BLUE SHIELD | Source: Ambulatory Visit | Attending: Internal Medicine | Admitting: Internal Medicine

## 2016-08-04 DIAGNOSIS — Z1231 Encounter for screening mammogram for malignant neoplasm of breast: Secondary | ICD-10-CM

## 2016-08-28 ENCOUNTER — Other Ambulatory Visit (INDEPENDENT_AMBULATORY_CARE_PROVIDER_SITE_OTHER): Payer: Self-pay | Admitting: Orthopedic Surgery

## 2016-08-28 ENCOUNTER — Ambulatory Visit (INDEPENDENT_AMBULATORY_CARE_PROVIDER_SITE_OTHER): Payer: Self-pay

## 2016-08-28 ENCOUNTER — Ambulatory Visit (INDEPENDENT_AMBULATORY_CARE_PROVIDER_SITE_OTHER): Payer: BLUE CROSS/BLUE SHIELD | Admitting: Orthopedic Surgery

## 2016-08-28 ENCOUNTER — Encounter (INDEPENDENT_AMBULATORY_CARE_PROVIDER_SITE_OTHER): Payer: Self-pay | Admitting: Orthopedic Surgery

## 2016-08-28 VITALS — BP 126/80 | HR 68 | Resp 14 | Ht 70.0 in | Wt 195.0 lb

## 2016-08-28 DIAGNOSIS — M25562 Pain in left knee: Secondary | ICD-10-CM

## 2016-08-28 DIAGNOSIS — M1712 Unilateral primary osteoarthritis, left knee: Secondary | ICD-10-CM

## 2016-08-28 DIAGNOSIS — M25462 Effusion, left knee: Secondary | ICD-10-CM | POA: Diagnosis not present

## 2016-08-28 MED ORDER — LIDOCAINE HCL 1 % IJ SOLN
5.0000 mL | INTRAMUSCULAR | Status: AC | PRN
  Administered 2016-08-28: 5 mL

## 2016-08-28 MED ORDER — METHYLPREDNISOLONE ACETATE 40 MG/ML IJ SUSP
80.0000 mg | INTRAMUSCULAR | Status: AC | PRN
  Administered 2016-08-28: 80 mg

## 2016-08-28 MED ORDER — BUPIVACAINE HCL 0.5 % IJ SOLN
3.0000 mL | INTRAMUSCULAR | Status: AC | PRN
  Administered 2016-08-28: 3 mL via INTRA_ARTICULAR

## 2016-08-28 NOTE — Progress Notes (Deleted)
Pt denies injury, pain started in Left knee 2 weeks agfo. PCP prescribed Diclofenac, not working, knee still swollen

## 2016-08-28 NOTE — Progress Notes (Signed)
Office Visit Note   Patient: Emily Reeves           Date of Birth: [birth year]           MRN: [id number] Visit Date: 08/28/2016              Requested by: Asencion Noble, MD [address] [place], [place] [address] PCP: Asencion Noble, MD   Assessment & Plan: Visit Diagnoses:  1. Unilateral primary osteoarthritis, left knee   2. Acute pain of left knee   3. Effusion, left knee     Plan:  #1 aspiration of the left knee with corticosteroid injection #2 fluid sent to lab for Synovial fluid analysis including that of culture, crystals, cell count and differential, protein, and glucose #3 she will call us if this is not effective and we then can order an MRI scan of the left knee to rule out internal derangement/torn medial meniscus  Follow-Up Instructions: Return if symptoms worsen or fail to improve call for MRI.   Orders:  Orders Placed This Encounter  Procedures  . Large Joint Injection/Arthrocentesis  . XR KNEE 3 VIEW LEFT   No orders of the defined types were placed in this encounter.     Procedures: Large Joint Inj Date/Time: 08/28/2016 12:58 PM Performed by: Biagio Borg D Authorized by: Biagio Borg D   Consent Given by:  Patient Timeout: prior to procedure the correct patient, procedure, and site was verified   Indications:  Pain, joint swelling and diagnostic evaluation Location:  Knee Site:  L knee Prep: patient was prepped and draped in usual sterile fashion   Needle Size:  25 G Needle Length:  1.5 inches Approach:  Anteromedial Ultrasound Guidance: No   Fluoroscopic Guidance: No   Arthrogram: No Medications:  5 mL lidocaine 1 %; 80 mg methylPREDNISolone acetate 40 MG/ML; 3 mL bupivacaine 0.5 % Aspiration Attempted: Yes   Aspirate amount (mL):  10 Aspirate:  Clear and yellow Lab: fluid sent for laboratory analysis   Patient tolerance:  Patient tolerated the procedure well with no immediate complications     Clinical Data: No additional  findings.   Subjective: Chief Complaint  Patient presents with  . Left Knee - Pain, Edema    60 year old [place] female seen today for evaluation of her left knee. She denies any history of injury or trauma to the left knee but noted some intermittent problems with her knee over the past several years. However about 2 weeks ago she started having increasing pain discomfort on the medial joint space area. It worsened to the point where she is states her knee had swollen and she is now having pain with every step. She has nighttime pain as well as pain with activities of daily living. Again she denies any history of injury or trauma this time. No past history of injury or trauma either. Left hip or groin pain. Seen today for evaluation.      Review of Systems  Constitutional: Negative.   HENT: Negative.   Eyes: Negative.   Respiratory: Negative.   Cardiovascular: Negative.   Gastrointestinal: Negative.   Endocrine:       Diabetic  Genitourinary: Negative.      Objective: Vital Signs: BP 126/80 (BP Location: Left Arm)   Pulse 68   Resp 14   Ht 5\' 10"  (1.778 m)   Wt 195 lb (88.5 kg)   BMI 27.98 kg/m   Physical Exam  Constitutional: She is oriented to person, place,  and time. She appears well-developed and well-nourished.  HENT:  Head: Normocephalic and atraumatic.  Eyes: EOM are normal. Pupils are equal, round, and reactive to light.  Neck: Neck supple.  No carotid bruits  Cardiovascular: Normal rate, regular rhythm, normal heart sounds and intact distal pulses.   Pulmonary/Chest: Effort normal.  Abdominal: Soft. Bowel sounds are normal.  Musculoskeletal:       Left knee: She exhibits effusion.  Neurological: She is alert and oriented to person, place, and time.  Skin: Skin is warm and dry.  Psychiatric: She has a normal mood and affect. Her behavior is normal. Judgment and thought content normal.    Right Knee Exam  Right knee exam is normal.   Left Knee  Exam   Tenderness  The patient is experiencing tenderness in the medial joint line.  Range of Motion  Extension: 5  Flexion: 100   Muscle Strength   The patient has normal left knee strength.  Tests  [place]:  Medial - positive Lateral - negative Drawer:       Anterior - negative     Posterior - negative Varus: negative Valgus: negative  Other  Sensation: normal Pulse: present Swelling: mild Effusion: effusion present  Comments:  Diffuse tenderness about the knee.  Apprehensive with ROM.  Calf supple, no calf pain. Neurovascular intact.      Specialty Comments:  No specialty comments available.  Imaging: Xr Knee 3 View Left  Result Date: 08/28/2016 4 view x-ray of the left knee reveals patellofemoral and tibiofemoral narrowing. This particular spurring both medially and laterally off the distal femur. A lateral positioning of the patella was noted. She has maintained joint spaces little bit decreased on the medial compartment.    PMFS History: Patient Active Problem List   Diagnosis Date Noted  . Posterior vaginal hernia/rectocele 04/22/2016  . Rectocele 04/09/2016  . Anal sphincter incontinence silent 12/25/2015  . Encounter for routine gynecological examination 05/10/2014  . Menopausal symptoms 05/10/2014  . Cervical lesion 05/10/2014  . Obesity (BMI 30-39.9) 05/10/2014  . Menopause present 02/21/2013   Past Medical History:  Diagnosis Date  . Anxiety   . Diabetes mellitus without complication ([place])   . GERD (gastroesophageal reflux disease)   . History of UTI   . Hyperlipidemia   . Hypertension   . Perimenopausal vasomotor symptoms     Family History  Problem Relation Age of Onset  . Cancer Mother     breast  . Heart disease Mother     angina  . Hypertension Mother   . Heart disease Father     heart attack  . Heart disease Maternal Grandfather     heart attack  . Diabetes Maternal Grandfather   . Hypertension Maternal Aunt   . Heart  disease Maternal Aunt     Past Surgical History:  Procedure Laterality Date  . APPENDECTOMY    . CESAREAN SECTION    . CHOLECYSTECTOMY    . ESOPHAGOGASTRODUODENOSCOPY    . FOOT SURGERY  2011  . lap choley    . RECTOCELE REPAIR N/A 04/22/2016   Procedure: POSTERIOR REPAIR (RECTOCELE);  Surgeon: Jonnie Kind, MD;  Location: [facility];  Service: Gynecology;  Laterality: N/A;  . TONSILECTOMY, ADENOIDECTOMY, BILATERAL MYRINGOTOMY AND TUBES    . TUBAL LIGATION     Social History   Occupational History  . Not on file.   Social History Main Topics  . Smoking status: Never Smoker  . Smokeless tobacco: Never Used  .  Alcohol use No  . Drug use: No  . Sexual activity: Not Currently    Birth control/ protection: Post-menopausal

## 2016-08-29 LAB — SYNOVIAL CELL COUNT + DIFF, W/ CRYSTALS
BASOPHILS, %: 0 %
Eosinophils-Synovial: 0 % (ref 0–2)
LYMPHOCYTES-SYNOVIAL FLD: 22 % (ref 0–74)
MONOCYTE/MACROPHAGE: 48 % (ref 0–69)
Neutrophil, Synovial: 18 % (ref 0–24)
SYNOVIOCYTES, %: 12 % (ref 0–15)
WBC, Synovial: 1535 cells/uL — ABNORMAL HIGH (ref <150)

## 2016-08-29 LAB — PROTEIN, SYNOVIAL FLUID: PROTEIN, SYNOVIAL FLUID: 3.5 g/dL — AB (ref 1.0–3.0)

## 2016-08-29 LAB — GLUCOSE, SYNOVIAL FLUID: GLUCOSE, SYNOVIAL FLUID: 81 mg/dL

## 2016-09-01 LAB — BODY FLUID CULTURE
Gram Stain: NONE SEEN
ORGANISM ID, BACTERIA: NO GROWTH

## 2016-09-04 ENCOUNTER — Other Ambulatory Visit (INDEPENDENT_AMBULATORY_CARE_PROVIDER_SITE_OTHER): Payer: Self-pay

## 2016-09-04 ENCOUNTER — Telehealth (INDEPENDENT_AMBULATORY_CARE_PROVIDER_SITE_OTHER): Payer: Self-pay | Admitting: Orthopaedic Surgery

## 2016-09-04 DIAGNOSIS — M25562 Pain in left knee: Principal | ICD-10-CM

## 2016-09-04 DIAGNOSIS — G8929 Other chronic pain: Secondary | ICD-10-CM

## 2016-09-04 NOTE — Telephone Encounter (Signed)
Patient states she is still having problems with her left knee and would like to have an MRI scheduled at [facility] or [facility]. Best cb# [phone]

## 2016-09-05 NOTE — Telephone Encounter (Signed)
Test/ to see if Aaron Edelman can see this

## 2016-09-09 NOTE — Telephone Encounter (Signed)
Sent referral to [facility] for scheduling. Spoke with patient and told her [facility] will call to schedule

## 2016-09-09 NOTE — Telephone Encounter (Signed)
Please advise 

## 2016-09-09 NOTE — Telephone Encounter (Signed)
Ok to do MRI, please schedule

## 2016-09-16 ENCOUNTER — Telehealth (INDEPENDENT_AMBULATORY_CARE_PROVIDER_SITE_OTHER): Payer: Self-pay

## 2016-09-16 NOTE — Telephone Encounter (Signed)
Please sign this and I can send back

## 2016-09-16 NOTE — Telephone Encounter (Signed)
Patient is sch to have an MRI tomorrow and [facility] radiology calling and advising that the order for the scan needs to be signed by Pricilla Loveless before they can do it. Please have this done today.

## 2016-09-16 NOTE — Telephone Encounter (Signed)
Where is the order for me to sign????

## 2016-09-17 ENCOUNTER — Ambulatory Visit (HOSPITAL_COMMUNITY)
Admission: RE | Admit: 2016-09-17 | Discharge: 2016-09-17 | Disposition: A | Discharge status: Home / Self Care | Payer: BLUE CROSS/BLUE SHIELD | Source: Ambulatory Visit | Attending: Orthopedic Surgery | Admitting: Orthopedic Surgery

## 2016-09-17 DIAGNOSIS — M25562 Pain in left knee: Secondary | ICD-10-CM

## 2016-09-17 DIAGNOSIS — M1712 Unilateral primary osteoarthritis, left knee: Secondary | ICD-10-CM | POA: Insufficient documentation

## 2016-09-17 DIAGNOSIS — G8929 Other chronic pain: Secondary | ICD-10-CM

## 2016-09-24 ENCOUNTER — Encounter (INDEPENDENT_AMBULATORY_CARE_PROVIDER_SITE_OTHER): Payer: Self-pay | Admitting: Orthopaedic Surgery

## 2016-09-24 ENCOUNTER — Ambulatory Visit (INDEPENDENT_AMBULATORY_CARE_PROVIDER_SITE_OTHER): Payer: BLUE CROSS/BLUE SHIELD | Admitting: Orthopaedic Surgery

## 2016-09-24 VITALS — BP 109/75 | HR 77 | Resp 14 | Ht 70.0 in | Wt 195.0 lb

## 2016-09-24 DIAGNOSIS — G8929 Other chronic pain: Secondary | ICD-10-CM

## 2016-09-24 DIAGNOSIS — M25562 Pain in left knee: Secondary | ICD-10-CM

## 2016-09-24 NOTE — Progress Notes (Signed)
Office Visit Note   Patient: Emily Reeves           Date of Birth: 1955/12/29           MRN: [id number] Visit Date: 09/24/2016              Requested by: Asencion Noble, MD [address] [place], [place] [address] PCP: Asencion Noble, MD   Assessment & Plan: Visit Diagnoses: No diagnosis found.  Plan: Mrs. Teng will be seen back on a when necessary basis. We had a long discussion regarding the problem with her left knee. Based on the MRI scan she has advanced tricompartmental arthritis. We had a long discussion regarding treatment options including continuation of diclofenac per Dr. Willey Blade, knee support, exercises, weight loss, and Visco supplementation. We also had a discussion about knee replacement as definitive procedure.  The MRI scan did demonstrate advanced tricompartmental degenerative changes associated with a small Baker's cyst. I also reviewed her x-rays from 08/28/2016. She has large osteophytes about the lateral compartment and the lateral patellofemoral joint.  Follow-Up Instructions: No Follow-up on file.   Orders:  No orders of the defined types were placed in this encounter.  No orders of the defined types were placed in this encounter.     Procedures: No procedures performed   Clinical Data: No additional findings.   Subjective: No chief complaint on file.   MRI results Pain in left knee, still swollen    Review of Systems   Objective: Vital Signs: There were no vitals taken for this visit.  Physical Exam  Ortho Exam left knee exam reveals minimal effusion. Predominantly pain in the lateral compartment and patellofemoral joint where there was positive crepitation. Slight increased valgus. No instability. No popliteal mass. No calf pain. Neurologically intact distally. The knee lacked about 10 to full extension with 100 of flexion.  Specialty Comments:  No specialty comments available.  Imaging: No results found.   PMFS History: Patient  Active Problem List   Diagnosis Date Noted  . Posterior vaginal hernia/rectocele 04/22/2016  . Rectocele 04/09/2016  . Anal sphincter incontinence silent 12/25/2015  . Encounter for routine gynecological examination 05/10/2014  . Menopausal symptoms 05/10/2014  . Cervical lesion 05/10/2014  . Obesity (BMI 30-39.9) 05/10/2014  . Menopause present 02/21/2013   Past Medical History:  Diagnosis Date  . Anxiety   . Diabetes mellitus without complication ([place])   . GERD (gastroesophageal reflux disease)   . History of UTI   . Hyperlipidemia   . Hypertension   . Perimenopausal vasomotor symptoms     Family History  Problem Relation Age of Onset  . Cancer Mother     breast  . Heart disease Mother     angina  . Hypertension Mother   . Heart disease Father     heart attack  . Heart disease Maternal Grandfather     heart attack  . Diabetes Maternal Grandfather   . Hypertension Maternal Aunt   . Heart disease Maternal Aunt     Past Surgical History:  Procedure Laterality Date  . APPENDECTOMY    . CESAREAN SECTION    . CHOLECYSTECTOMY    . ESOPHAGOGASTRODUODENOSCOPY    . FOOT SURGERY  2011  . lap choley    . RECTOCELE REPAIR N/A 04/22/2016   Procedure: POSTERIOR REPAIR (RECTOCELE);  Surgeon: Jonnie Kind, MD;  Location: [facility];  Service: Gynecology;  Laterality: N/A;  . TONSILECTOMY, ADENOIDECTOMY, BILATERAL MYRINGOTOMY AND TUBES    .  TUBAL LIGATION     Social History   Occupational History  . Not on file.   Social History Main Topics  . Smoking status: Never Smoker  . Smokeless tobacco: Never Used  . Alcohol use No  . Drug use: No  . Sexual activity: Not Currently    Birth control/ protection: Post-menopausal

## 2016-10-02 DIAGNOSIS — Z131 Encounter for screening for diabetes mellitus: Secondary | ICD-10-CM | POA: Diagnosis not present

## 2016-11-08 DIAGNOSIS — E119 Type 2 diabetes mellitus without complications: Secondary | ICD-10-CM | POA: Diagnosis not present

## 2016-11-14 DIAGNOSIS — Z6830 Body mass index (BMI) 30.0-30.9, adult: Secondary | ICD-10-CM | POA: Diagnosis not present

## 2016-11-14 DIAGNOSIS — E119 Type 2 diabetes mellitus without complications: Secondary | ICD-10-CM | POA: Diagnosis not present

## 2016-11-14 DIAGNOSIS — I10 Essential (primary) hypertension: Secondary | ICD-10-CM | POA: Diagnosis not present

## 2016-11-14 DIAGNOSIS — E785 Hyperlipidemia, unspecified: Secondary | ICD-10-CM | POA: Diagnosis not present

## 2016-12-10 DIAGNOSIS — Z111 Encounter for screening for respiratory tuberculosis: Secondary | ICD-10-CM | POA: Diagnosis not present

## 2016-12-26 ENCOUNTER — Other Ambulatory Visit: Payer: Self-pay | Admitting: Obstetrics and Gynecology

## 2016-12-30 NOTE — Telephone Encounter (Signed)
Xanax o.5 x 30 tabs, ref x 2 ordered.

## 2016-12-31 ENCOUNTER — Telehealth: Payer: Self-pay | Admitting: *Deleted

## 2016-12-31 NOTE — Telephone Encounter (Signed)
LMOVM that prescription was sent to pharmacy.  

## 2017-02-06 DIAGNOSIS — E119 Type 2 diabetes mellitus without complications: Secondary | ICD-10-CM | POA: Diagnosis not present

## 2017-02-06 DIAGNOSIS — H2513 Age-related nuclear cataract, bilateral: Secondary | ICD-10-CM | POA: Diagnosis not present

## 2017-02-06 DIAGNOSIS — H04123 Dry eye syndrome of bilateral lacrimal glands: Secondary | ICD-10-CM | POA: Diagnosis not present

## 2017-02-06 DIAGNOSIS — H1045 Other chronic allergic conjunctivitis: Secondary | ICD-10-CM | POA: Diagnosis not present

## 2017-02-13 ENCOUNTER — Other Ambulatory Visit (INDEPENDENT_AMBULATORY_CARE_PROVIDER_SITE_OTHER): Payer: Self-pay | Admitting: *Deleted

## 2017-02-13 DIAGNOSIS — Z1211 Encounter for screening for malignant neoplasm of colon: Secondary | ICD-10-CM

## 2017-03-11 ENCOUNTER — Telehealth (INDEPENDENT_AMBULATORY_CARE_PROVIDER_SITE_OTHER): Payer: Self-pay | Admitting: Orthopaedic Surgery

## 2017-03-11 NOTE — Telephone Encounter (Signed)
Patient calling in reference to knee injections Dr. Durward Fortes told her about. Would like to start process of getting approval for them if possible. Please call patient to advise.

## 2017-03-12 NOTE — Telephone Encounter (Signed)
ok 

## 2017-03-12 NOTE — Telephone Encounter (Signed)
OK to send for referral? Dictation from 09/2016 mentioned this as an option.

## 2017-03-13 NOTE — Telephone Encounter (Signed)
OK for Euflexxa

## 2017-03-14 DIAGNOSIS — I10 Essential (primary) hypertension: Secondary | ICD-10-CM | POA: Diagnosis not present

## 2017-03-14 DIAGNOSIS — Z79899 Other long term (current) drug therapy: Secondary | ICD-10-CM | POA: Diagnosis not present

## 2017-03-14 DIAGNOSIS — E119 Type 2 diabetes mellitus without complications: Secondary | ICD-10-CM | POA: Diagnosis not present

## 2017-03-14 DIAGNOSIS — E785 Hyperlipidemia, unspecified: Secondary | ICD-10-CM | POA: Diagnosis not present

## 2017-03-14 DIAGNOSIS — K219 Gastro-esophageal reflux disease without esophagitis: Secondary | ICD-10-CM | POA: Diagnosis not present

## 2017-03-20 DIAGNOSIS — E119 Type 2 diabetes mellitus without complications: Secondary | ICD-10-CM | POA: Diagnosis not present

## 2017-03-20 DIAGNOSIS — E785 Hyperlipidemia, unspecified: Secondary | ICD-10-CM | POA: Diagnosis not present

## 2017-04-10 NOTE — Telephone Encounter (Signed)
Submitted online to Euflexxa for VOB, will followup.

## 2017-04-17 NOTE — Telephone Encounter (Signed)
Please call patient and schedule Eulfexxa left knee x 3 injections buy and bill.   Please tell her that her primary ins covers 100% of meds, and secondary will cover 90% of left over costs, so there should be no cost to her.  No PA needed.  Thanks-

## 2017-04-17 NOTE — Telephone Encounter (Incomplete)
Euflexxa VOB states that Euflexxa is covered at 100%, no PA needed, ok to call patient and schedule

## 2017-05-04 ENCOUNTER — Encounter (INDEPENDENT_AMBULATORY_CARE_PROVIDER_SITE_OTHER): Payer: Self-pay

## 2017-05-04 ENCOUNTER — Ambulatory Visit: Payer: BLUE CROSS/BLUE SHIELD | Admitting: Obstetrics and Gynecology

## 2017-05-05 ENCOUNTER — Telehealth (INDEPENDENT_AMBULATORY_CARE_PROVIDER_SITE_OTHER): Payer: Self-pay | Admitting: *Deleted

## 2017-05-05 ENCOUNTER — Encounter (INDEPENDENT_AMBULATORY_CARE_PROVIDER_SITE_OTHER): Payer: Self-pay | Admitting: *Deleted

## 2017-05-05 DIAGNOSIS — Z1211 Encounter for screening for malignant neoplasm of colon: Secondary | ICD-10-CM

## 2017-05-05 NOTE — Telephone Encounter (Signed)
Patient needs trilyte -- screening

## 2017-05-07 MED ORDER — PEG 3350-KCL-NA BICARB-NACL 420 G PO SOLR: 4000.0000 mL | Freq: Once | ORAL | 0 refills | Status: AC

## 2017-05-22 ENCOUNTER — Telehealth (INDEPENDENT_AMBULATORY_CARE_PROVIDER_SITE_OTHER): Payer: Self-pay | Admitting: *Deleted

## 2017-05-22 NOTE — Telephone Encounter (Signed)
agree

## 2017-05-22 NOTE — Telephone Encounter (Signed)
Referring MD/PCP: fagan   Procedure: tcs  Reason/Indication:  screening  Has patient had this procedure before?  Yes, 10 yrs ago  If so, when, by whom and where?    Is there a family history of colon cancer?  no  Who?  What age when diagnosed?    Is patient diabetic?   Yes, diet control      Does patient have prosthetic heart valve or mechanical valve?  no  Do you have a pacemaker?  no  Has patient ever had endocarditis? no  Has patient had joint replacement within last 12 months?  no  Does patient tend to be constipated or take laxatives? no  Does patient have a history of alcohol/drug use?  no  Is patient on Coumadin, Plavix and/or Aspirin? no  Medications: losartan 50 mg daily, atorvastatin 10 mg daily, nexium 40 mg daily, vit d 1000 iu daily, diclofenac daily  Allergies: nkda  Medication Adjustment per Dr Laural Golden:   Procedure date & time: 06/18/17 at 830

## 2017-06-09 NOTE — Telephone Encounter (Signed)
I left a message for patient to call, and schedule injections with Aaron Edelman.

## 2017-06-17 ENCOUNTER — Encounter (HOSPITAL_COMMUNITY): Payer: Self-pay | Admitting: *Deleted

## 2017-06-18 ENCOUNTER — Encounter (HOSPITAL_COMMUNITY): Payer: Self-pay | Admitting: *Deleted

## 2017-06-18 ENCOUNTER — Encounter (HOSPITAL_COMMUNITY)
Admission: RE | Disposition: A | Discharge status: Home / Self Care | Payer: Self-pay | Source: Ambulatory Visit | Attending: Internal Medicine

## 2017-06-18 ENCOUNTER — Ambulatory Visit (HOSPITAL_COMMUNITY)
Admission: RE | Admit: 2017-06-18 | Discharge: 2017-06-18 | Disposition: A | Discharge status: Home / Self Care | Payer: BLUE CROSS/BLUE SHIELD | Source: Ambulatory Visit | Attending: Internal Medicine | Admitting: Internal Medicine

## 2017-06-18 DIAGNOSIS — Z79899 Other long term (current) drug therapy: Secondary | ICD-10-CM | POA: Diagnosis not present

## 2017-06-18 DIAGNOSIS — K648 Other hemorrhoids: Secondary | ICD-10-CM | POA: Diagnosis not present

## 2017-06-18 DIAGNOSIS — I10 Essential (primary) hypertension: Secondary | ICD-10-CM | POA: Diagnosis not present

## 2017-06-18 DIAGNOSIS — Z791 Long term (current) use of non-steroidal anti-inflammatories (NSAID): Secondary | ICD-10-CM | POA: Insufficient documentation

## 2017-06-18 DIAGNOSIS — E785 Hyperlipidemia, unspecified: Secondary | ICD-10-CM | POA: Insufficient documentation

## 2017-06-18 DIAGNOSIS — E119 Type 2 diabetes mellitus without complications: Secondary | ICD-10-CM | POA: Diagnosis not present

## 2017-06-18 DIAGNOSIS — M1990 Unspecified osteoarthritis, unspecified site: Secondary | ICD-10-CM | POA: Diagnosis not present

## 2017-06-18 DIAGNOSIS — F419 Anxiety disorder, unspecified: Secondary | ICD-10-CM | POA: Diagnosis not present

## 2017-06-18 DIAGNOSIS — K219 Gastro-esophageal reflux disease without esophagitis: Secondary | ICD-10-CM | POA: Diagnosis not present

## 2017-06-18 DIAGNOSIS — D125 Benign neoplasm of sigmoid colon: Secondary | ICD-10-CM | POA: Insufficient documentation

## 2017-06-18 DIAGNOSIS — K5730 Diverticulosis of large intestine without perforation or abscess without bleeding: Secondary | ICD-10-CM | POA: Insufficient documentation

## 2017-06-18 DIAGNOSIS — Z1211 Encounter for screening for malignant neoplasm of colon: Secondary | ICD-10-CM | POA: Insufficient documentation

## 2017-06-18 HISTORY — PX: POLYPECTOMY: SHX5525

## 2017-06-18 HISTORY — DX: Unspecified osteoarthritis, unspecified site: M19.90

## 2017-06-18 HISTORY — PX: COLONOSCOPY: SHX5424

## 2017-06-18 LAB — GLUCOSE, CAPILLARY: Glucose-Capillary: 94 mg/dL (ref 65–99)

## 2017-06-18 SURGERY — COLONOSCOPY
Anesthesia: Moderate Sedation

## 2017-06-18 MED ORDER — MIDAZOLAM HCL 5 MG/5ML IJ SOLN
INTRAMUSCULAR | Status: DC | PRN
  Administered 2017-06-18: 2 mg via INTRAVENOUS
  Administered 2017-06-18: 1 mg via INTRAVENOUS
  Administered 2017-06-18: 2 mg via INTRAVENOUS
  Administered 2017-06-18: 1 mg via INTRAVENOUS
  Administered 2017-06-18 (×2): 2 mg via INTRAVENOUS

## 2017-06-18 MED ORDER — MEPERIDINE HCL 50 MG/ML IJ SOLN
INTRAMUSCULAR | Status: DC | PRN
  Administered 2017-06-18 (×2): 25 mg via INTRAVENOUS

## 2017-06-18 MED ORDER — MEPERIDINE HCL 50 MG/ML IJ SOLN
INTRAMUSCULAR | Status: DC
Start: 2017-06-18 — End: 2017-06-18
  Filled 2017-06-18: qty 1

## 2017-06-18 MED ORDER — SODIUM CHLORIDE 0.9 % IV SOLN
INTRAVENOUS | Status: DC
  Administered 2017-06-18: 08:00:00 via INTRAVENOUS

## 2017-06-18 MED ORDER — SIMETHICONE 40 MG/0.6ML PO SUSP
ORAL | Status: DC | PRN
  Administered 2017-06-18: 09:00:00

## 2017-06-18 MED ORDER — MIDAZOLAM HCL 5 MG/5ML IJ SOLN
INTRAMUSCULAR | Status: DC
Start: 2017-06-18 — End: 2017-06-18
  Filled 2017-06-18: qty 10

## 2017-06-18 NOTE — Discharge Instructions (Signed)
No aspirin or NSAIDs for 2 days. Resume other medications and diet as before. No driving for 24 hours. Physician will call with biopsy results.      Colonoscopy, Adult, Care After This sheet gives you information about how to care for yourself after your procedure. Your doctor may also give you more specific instructions. If you have problems or questions, call your doctor. Follow these instructions at home: General instructions   For the first 24 hours after the procedure: ? Do not drive or use machinery. ? Do not sign important documents. ? Do not drink alcohol. ? Do your daily activities more slowly than normal. ? Eat foods that are soft and easy to digest. ? Rest often.  Take over-the-counter or prescription medicines only as told by your doctor.  It is up to you to get the results of your procedure. Ask your doctor, or the department performing the procedure, when your results will be ready. To help cramping and bloating:  Try walking around.  Put heat on your belly (abdomen) as told by your doctor. Use a heat source that your doctor recommends, such as a moist heat pack or a heating pad. ? Put a towel between your skin and the heat source. ? Leave the heat on for 20-30 minutes. ? Remove the heat if your skin turns bright red. This is especially important if you cannot feel pain, heat, or cold. You can get burned. Eating and drinking  Drink enough fluid to keep your pee (urine) clear or pale yellow.  Return to your normal diet as told by your doctor. Avoid heavy or fried foods that are hard to digest.  Avoid drinking alcohol for as long as told by your doctor. Contact a doctor if:  You have blood in your poop (stool) 2-3 days after the procedure. Get help right away if:  You have more than a small amount of blood in your poop.  You see large clumps of tissue (blood clots) in your poop.  Your belly is swollen.  You feel sick to your stomach (nauseous).  You  throw up (vomit).  You have a fever.  You have belly pain that gets worse, and medicine does not help your pain. This information is not intended to replace advice given to you by your health care provider. Make sure you discuss any questions you have with your health care provider. Document Released: 11/22/2010 Document Revised: 07/14/2016 Document Reviewed: 07/14/2016 Elsevier Interactive Patient Education  2017 [employer].   Colon Polyps Polyps are tissue growths inside the body. Polyps can grow in many places, including the large intestine (colon). A polyp may be a round bump or a mushroom-shaped growth. You could have one polyp or several. Most colon polyps are noncancerous (benign). However, some colon polyps can become cancerous over time. What are the causes? The exact cause of colon polyps is not known. What increases the risk? This condition is more likely to develop in people who:  Have a family history of colon cancer or colon polyps.  Are older than 80 or older than 45 if they are African American.  Have inflammatory bowel disease, such as ulcerative colitis or Crohn disease.  Are overweight.  Smoke cigarettes.  Do not get enough exercise.  Drink too much alcohol.  Eat a diet that is: ? High in fat and red meat. ? Low in fiber.  Had childhood cancer that was treated with abdominal radiation.  What are the signs or symptoms? Most polyps  do not cause symptoms. If you have symptoms, they may include:  Blood coming from your rectum when having a bowel movement.  Blood in your stool.The stool may look dark red or black.  A change in bowel habits, such as constipation or diarrhea.  How is this diagnosed? This condition is diagnosed with a colonoscopy. This is a procedure that uses a lighted, flexible scope to look at the inside of your colon. How is this treated? Treatment for this condition involves removing any polyps that are found. Those polyps will  then be tested for cancer. If cancer is found, your health care provider will talk to you about options for colon cancer treatment. Follow these instructions at home: Diet  Eat plenty of fiber, such as fruits, vegetables, and whole grains.  Eat foods that are high in calcium and vitamin D, such as milk, cheese, yogurt, eggs, liver, fish, and broccoli.  Limit foods high in fat, red meats, and processed meats, such as hot dogs, sausage, bacon, and lunch meats.  Maintain a healthy weight, or lose weight if recommended by your health care provider. General instructions  Do not smoke cigarettes.  Do not drink alcohol excessively.  Keep all follow-up visits as told by your health care provider. This is important. This includes keeping regularly scheduled colonoscopies. Talk to your health care provider about when you need a colonoscopy.  Exercise every day or as told by your health care provider. Contact a health care provider if:  You have new or worsening bleeding during a bowel movement.  You have new or increased blood in your stool.  You have a change in bowel habits.  You unexpectedly lose weight. This information is not intended to replace advice given to you by your health care provider. Make sure you discuss any questions you have with your health care provider. Document Released: 07/16/2004 Document Revised: 03/27/2016 Document Reviewed: 09/10/2015 Elsevier Interactive Patient Education  [employer].

## 2017-06-18 NOTE — H&P (Signed)
Emily Reeves is an 61 y.o. female.   Chief Complaint: Patient is here for colonoscopy. HPI: Patient is 61 year old African female who is in for screening colonoscopy. Last exam was normal about 11 years ago. She denies abdominal pain change in bowel habits or rectal bleeding. Family history is negative for CRC.  Past Medical History:  Diagnosis Date  . Anxiety   . Arthritis   . Diabetes mellitus without complication (Emily Reeves)   . GERD (gastroesophageal reflux disease)   . History of UTI   . Hyperlipidemia   . Hypertension   . Perimenopausal vasomotor symptoms     Past Surgical History:  Procedure Laterality Date  . APPENDECTOMY    . CESAREAN SECTION    . CHOLECYSTECTOMY    . FOOT SURGERY  2011  . RECTOCELE REPAIR N/A 04/22/2016   Procedure: POSTERIOR REPAIR (RECTOCELE);  Surgeon: Jonnie Kind, MD;  Location: [facility];  Service: Gynecology;  Laterality: N/A;  . TONSILLECTOMY    . TUBAL LIGATION      Family History  Problem Relation Age of Onset  . Cancer Mother        breast  . Heart disease Mother        angina  . Hypertension Mother   . Heart disease Father        heart attack  . Heart disease Maternal Grandfather        heart attack  . Diabetes Maternal Grandfather   . Hypertension Maternal Aunt   . Heart disease Maternal Aunt   . Colon cancer Neg Hx    Social History:  reports that she has never smoked. She has never used smokeless tobacco. She reports that she does not drink alcohol or use drugs.  Allergies: No Known Allergies  Medications Prior to Admission  Medication Sig Dispense Refill  . ALPRAZolam (XANAX) 0.5 MG tablet TAKE (1) TABLET BY MOUTH ONCE DAILY AS NEEDED FOR ANXIETY 30 tablet 2  . atorvastatin (LIPITOR) 10 MG tablet Take 10 mg by mouth every morning.   12  . diclofenac (CATAFLAM) 50 MG tablet Take 50 mg by mouth daily.     Marland Kitchen esomeprazole (NEXIUM) 40 MG capsule Take 40 mg by mouth daily.    . Ginkgo Biloba 40 MG TABS Take 1 tablet by mouth daily.     Marland Kitchen loratadine (CLARITIN) 10 MG tablet Take 10 mg by mouth daily as needed for allergies.    Marland Kitchen losartan-hydrochlorothiazide (HYZAAR) 100-25 MG tablet Take 1 tablet by mouth daily.  4  . B Complex Vitamins (VITAMIN B COMPLEX PO) Take 1 tablet by mouth daily.    . Biotin 1 MG CAPS Take 1 tablet by mouth daily.      Results for orders placed or performed during the hospital encounter of 06/18/17 (from the past 48 hour(s))  Glucose, capillary     Status: None   Collection Time: 06/18/17  7:59 AM  Result Value Ref Range   Glucose-Capillary 94 65 - 99 mg/dL   No results found.  ROS  Blood pressure 123/70, pulse 78, temperature 98.3 F (36.8 C), temperature source Oral, resp. rate 20, height 5\' 10"  (1.778 m), weight 195 lb (88.5 kg), SpO2 100 %. Physical Exam  Constitutional: She appears well-developed and well-nourished.  HENT:  Mouth/Throat: Oropharynx is clear and moist.  Eyes: Conjunctivae are normal. No scleral icterus.  Neck: No thyromegaly present.  Cardiovascular: Normal rate, regular rhythm and normal heart sounds.   No murmur heard. Respiratory: Effort normal  and breath sounds normal.  GI: Soft. She exhibits no distension and no mass. There is no tenderness.  Lower midline scar.  Musculoskeletal: She exhibits no edema.  Lymphadenopathy:    She has no cervical adenopathy.  Neurological: She is alert.  Skin: Skin is warm and dry.     Assessment/Plan Average risk screening colonoscopy.  Hildred Laser, MD 06/18/2017, 8:27 AM

## 2017-06-18 NOTE — Op Note (Addendum)
[facility] Patient Name: Emily Reeves Procedure Date: 06/18/2017 8:08 AM MRN: [id number] Date of Birth: 07-27-1956 Attending MD: Hildred Laser , MD CSN: [id number] Age: 61 Admit Type: Outpatient Procedure:                Colonoscopy Indications:              Screening for colorectal malignant neoplasm Providers:                Hildred Laser, MD, Otis Peak B. Sharon Seller, RN, Rosina Lowenstein, RN Referring MD:             Asencion Noble, MD Medicines:                Meperidine 50 mg IV, Midazolam 10 mg IV Complications:            No immediate complications. Estimated Blood Loss:     Estimated blood loss was minimal. Procedure:                Pre-Anesthesia Assessment:                           - Prior to the procedure, a History and Physical                            was performed, and patient medications and                            allergies were reviewed. The patient's tolerance of                            previous anesthesia was also reviewed. The risks                            and benefits of the procedure and the sedation                            options and risks were discussed with the patient.                            All questions were answered, and informed consent                            was obtained. Prior Anticoagulants: The patient                            last took previous NSAID medication 2 days prior to                            the procedure. ASA Grade Assessment: II - A patient                            with mild systemic disease. After reviewing the  risks and benefits, the patient was deemed in                            satisfactory condition to undergo the procedure.                           After obtaining informed consent, the colonoscope                            was passed under direct vision. Throughout the                            procedure, the patient's blood pressure, pulse, and                             oxygen saturations were monitored continuously. The                            EC-[id number] ([id number]) scope was introduced through                            the anus and advanced to the the cecum, identified                            by appendiceal orifice and ileocecal valve. The                            [id number] ([id number]) scope was introduced through                            the anus and advanced to the. The colonoscopy was                            technically difficult and complex due to restricted                            mobility of the colon. Successful completion of the                            procedure was aided by increasing the dose of                            sedation medication, using manual pressure,                            withdrawing and reinserting the scope and                            withdrawing the scope and replacing with the                            'babyscope'. The patient tolerated the procedure  well. The quality of the bowel preparation was                            excellent. The ileocecal valve, appendiceal                            orifice, and rectum were photographed. Scope In: 8:37:45 AM Scope Out: 9:19:48 AM Scope Withdrawal Time: 0 hours 16 minutes 40 seconds  Total Procedure Duration: 0 hours 42 minutes 3 seconds  Findings:      The perianal and digital rectal examinations were normal.      A 6 mm polyp(cluster) was found in the proximal sigmoid colon. The polyp       was sessile. The polyp was removed with a cold snare. Polyp resection       was incomplete. remaining polyp removed with cold biopsy forceps. The       resected tissue was retrieved. The pathology specimen was placed into       Bottle Number 1.      A single small-mouthed diverticulum was found in the sigmoid colon.      Internal hemorrhoids were found during retroflexion. The hemorrhoids       were  medium-sized. Impression:               - One 6 mm polyp(cluster) in the proximal sigmoid                            colon, removed with a cold snare and cold biopsy.                            Resected tissue retrieved. Biopsied.                           - Diverticulosis in the sigmoid colon.                           - Internal hemorrhoids. Moderate Sedation:      Moderate (conscious) sedation was administered by the endoscopy nurse       and supervised by the endoscopist. The following parameters were       monitored: oxygen saturation, heart rate, blood pressure, CO2       capnography and response to care. Total physician intraservice time was       45 minutes. Recommendation:           - Patient has a contact number available for                            emergencies. The signs and symptoms of potential                            delayed complications were discussed with the                            patient. Return to normal activities tomorrow.                            Written discharge instructions were provided  to the                            patient.                           - Patient has a contact number available for                            emergencies. The signs and symptoms of potential                            delayed complications were discussed with the                            patient. Return to normal activities tomorrow.                            Written discharge instructions were provided to the                            patient.                           - Resume previous diet today.                           - Continue present medications.                           - No aspirin, ibuprofen, naproxen, or other                            non-steroidal anti-inflammatory drugs for 2 days.                           - Await pathology results.                           - Repeat colonoscopy date to be determined after                            pending  pathology results are reviewed. Procedure Code(s):        --- Professional ---                           [phone], Colonoscopy, flexible; with removal of                            tumor(s), polyp(s), or other lesion(s) by snare                            technique                           99152, Moderate sedation services provided by the  same physician or other qualified health care                            professional performing the diagnostic or                            therapeutic service that the sedation supports,                            requiring the presence of an independent trained                            observer to assist in the monitoring of the                            patient's level of consciousness and physiological                            status; initial 15 minutes of intraservice time,                            patient age 15 years or older                           [phone], Moderate sedation services; each additional                            15 minutes intraservice time                           99153, Moderate sedation services; each additional                            15 minutes intraservice time Diagnosis Code(s):        --- Professional ---                           Z12.11, Encounter for screening for malignant                            neoplasm of colon                           D12.5, Benign neoplasm of sigmoid colon                           K64.8, Other hemorrhoids                           K57.30, Diverticulosis of large intestine without                            perforation or abscess without bleeding CPT copyright 2016 [employer]. All rights reserved. The codes documented in this report are preliminary and upon coder review may  be revised to meet current compliance requirements. Hildred Laser, MD Bernadene Person  Laural Golden, MD 06/18/2017 9:28:58 AM This report has been signed electronically. Number of  Addenda: 0

## 2017-06-22 ENCOUNTER — Encounter (HOSPITAL_COMMUNITY): Payer: Self-pay | Admitting: Internal Medicine

## 2017-06-24 ENCOUNTER — Ambulatory Visit (INDEPENDENT_AMBULATORY_CARE_PROVIDER_SITE_OTHER): Payer: BLUE CROSS/BLUE SHIELD | Admitting: Orthopedic Surgery

## 2017-07-01 ENCOUNTER — Ambulatory Visit (INDEPENDENT_AMBULATORY_CARE_PROVIDER_SITE_OTHER): Payer: BLUE CROSS/BLUE SHIELD | Admitting: Orthopedic Surgery

## 2017-07-01 ENCOUNTER — Encounter (INDEPENDENT_AMBULATORY_CARE_PROVIDER_SITE_OTHER): Payer: Self-pay | Admitting: Orthopedic Surgery

## 2017-07-01 VITALS — BP 125/75 | HR 73 | Ht 70.0 in | Wt 200.0 lb

## 2017-07-01 DIAGNOSIS — M1712 Unilateral primary osteoarthritis, left knee: Secondary | ICD-10-CM

## 2017-07-01 MED ORDER — LIDOCAINE HCL 1 % IJ SOLN
3.0000 mL | INTRAMUSCULAR | Status: AC | PRN
  Administered 2017-07-01: 3 mL

## 2017-07-01 MED ORDER — SODIUM HYALURONATE (VISCOSUP) 20 MG/2ML IX SOSY
20.0000 mg | PREFILLED_SYRINGE | INTRA_ARTICULAR | Status: AC | PRN
  Administered 2017-07-01: 20 mg via INTRA_ARTICULAR

## 2017-07-01 NOTE — Progress Notes (Signed)
Office Visit Note   Patient: Emily Reeves           Date of Birth: [birth year]           MRN: [id number] Visit Date: 07/01/2017              Requested by: Asencion Noble, MD [address] [place], [place] [address] PCP: Asencion Noble, MD   Assessment & Plan: Visit Diagnoses:  1. Unilateral primary osteoarthritis, left knee     Plan:  #1: Euflex injection #1 to the left knee  Follow-Up Instructions: Return in about 1 week (around 07/08/2017).   Orders:  No orders of the defined types were placed in this encounter.  No orders of the defined types were placed in this encounter.     Procedures: Large Joint Inj Date/Time: 07/01/2017 4:45 PM Performed by: Biagio Borg D Authorized by: Biagio Borg D   Consent Given by:  Patient Timeout: prior to procedure the correct patient, procedure, and site was verified   Indications:  Pain and joint swelling Location:  Knee Site:  L knee Prep: patient was prepped and draped in usual sterile fashion   Needle Size:  25 G Needle Length:  1.5 inches Approach:  Anteromedial Ultrasound Guidance: No   Fluoroscopic Guidance: No   Arthrogram: No   Medications:  20 mg Sodium Hyaluronate 20 MG/2ML; 3 mL lidocaine 1 % Aspiration Attempted: No   Patient tolerance:  Patient tolerated the procedure well with no immediate complications     Clinical Data: No additional findings.   Subjective: Chief Complaint  Patient presents with  . Follow-up    PATIENT HERE FOR INJECTION    HPI  Emily Reeves is a 61 year old African-American female who is seen today for evaluation of her left knee. I saw her back in October 2017 and at that time she had pain and discomfort in the left knee. She denied any history of injury or trauma but did note some intermittent problems with her knee over several years. However 2 weeks before that visit she started having increasing pain discomfort in the medial joint line which it worsened to the point where her  knee with had swollen and she was not having at that time pain with every step. She also had nighttime pain as well as pain with activities of daily living. She was seen back by Dr. Durward Fortes in 09/24/2016 and there was discussion at that time of the results of the MRI scan of her left knee. Results were noted as follows: Dominant finding is advanced tricompartmental osteoarthritis. Degenerative maceration of the anterior horn of the lateral Meniscus. Mucoid degeneration of the ACL with an associated ACL ganglion.  A discussion followed at that time with possible consideration of Visco supplementation. Her symptoms have gotten now to the point where she would like to proceed with that at this time.      Review of Systems  Constitutional: Negative.   HENT: Negative.   Eyes: Negative.   Respiratory: Negative.   Cardiovascular: Negative.   Gastrointestinal: Negative.   Endocrine: Negative.   Genitourinary: Negative.   Musculoskeletal: Positive for joint swelling.  Skin: Negative.   Allergic/Immunologic: Negative.   Neurological: Negative.   Hematological: Negative.   Psychiatric/Behavioral: Negative.      Objective: Vital Signs: BP 125/75 (BP Location: Left Arm, Patient Position: Sitting, Cuff Size: Normal)   Pulse 73   Ht 5\' 10"  (1.778 m)   Wt 200 lb (90.7 kg)   BMI 28.70  kg/m   Physical Exam  Constitutional: She is oriented to person, place, and time. She appears well-developed and well-nourished.  HENT:  Head: Normocephalic and atraumatic.  Eyes: Pupils are equal, round, and reactive to light. EOM are normal.  Pulmonary/Chest: Effort normal.  Neurological: She is alert and oriented to person, place, and time.  Skin: Skin is warm and dry.  Psychiatric: She has a normal mood and affect. Her behavior is normal. Judgment and thought content normal.    Ortho Exam  Exam today reveals range of motion from about 15 shy of full extension to about 100 of flexion. Diffusely  tender to palpation. There is some crepitance with range of motion. Neurovascular intact.  Specialty Comments:  No specialty comments available.  Imaging: CLINICAL DATA:  Left knee pain, swelling and locking for 6-8 weeks. No known injury. Initial encounter.  EXAM: MRI OF THE LEFT KNEE WITHOUT CONTRAST- 09/17/2016  TECHNIQUE: Multiplanar, multisequence MR imaging of the knee was performed. No intravenous contrast was administered.  COMPARISON:  Plain films left knee performed at [facility] 08/28/2016.  FINDINGS: MENISCI  Medial meniscus:  Intact.  Lateral meniscus: Degenerative maceration of the anterior horn is identified.  LIGAMENTS  Cruciates: Intact. Mucoid degeneration of the ACL is seen. A cyst anterior to the ACL attachment to the tibia measures 0.8 cm craniocaudal x 1.4 cm AP x 1.5 cm transverse and is compatible with a ganglion. The PCL is unremarkable.  Collaterals:  Intact.  CARTILAGE  Patellofemoral: Extensive cartilage loss is worst along the lateral facet and lateral femoral trochlea.  Medial:  Markedly degenerated and irregular.  Lateral:  Markedly degenerated and irregular.  Joint:  Moderate joint effusion.  Popliteal Fossa: Baker's cyst measures up to 1.9 cm AP x 2.3 cm transverse x 6.3 cm craniocaudal.  Extensor Mechanism:  Intact.  Bones: Bulky tricompartmental osteophytosis is seen. There is some subchondral edema about the lateral compartment and at the ACL attachment to the lateral intercondylar notch of the femur.  Other: Small ganglion cysts are seen about the gastrocnemius attachments to the femur.  IMPRESSION: Dominant finding is advanced tricompartmental osteoarthritis.  Degenerative maceration of the anterior horn of the lateral meniscus.  Mucoid degeneration of the ACL with an associated ACL ganglion.   Electronically Signed   By: Inge Rise M.D.   On: 09/18/2016 08:52  PMFS  History: Patient Active Problem List   Diagnosis Date Noted  . Special screening for malignant neoplasms, colon 02/13/2017  . Posterior vaginal hernia/rectocele 04/22/2016  . Rectocele 04/09/2016  . Anal sphincter incontinence silent 12/25/2015  . Encounter for routine gynecological examination 05/10/2014  . Menopausal symptoms 05/10/2014  . Cervical lesion 05/10/2014  . Obesity (BMI 30-39.9) 05/10/2014  . Menopause present 02/21/2013   Past Medical History:  Diagnosis Date  . Anxiety   . Arthritis   . Diabetes mellitus without complication ([place])   . GERD (gastroesophageal reflux disease)   . History of UTI   . Hyperlipidemia   . Hypertension   . Perimenopausal vasomotor symptoms     Family History  Problem Relation Age of Onset  . Cancer Mother        breast  . Heart disease Mother        angina  . Hypertension Mother   . Heart disease Father        heart attack  . Heart disease Maternal Grandfather        heart attack  . Diabetes Maternal Grandfather   . Hypertension  Maternal Aunt   . Heart disease Maternal Aunt   . Colon cancer Neg Hx     Past Surgical History:  Procedure Laterality Date  . APPENDECTOMY    . CESAREAN SECTION    . CHOLECYSTECTOMY    . COLONOSCOPY N/A 06/18/2017   Procedure: COLONOSCOPY;  Surgeon: Rogene Houston, MD;  [facility];  Service: Endoscopy;  Laterality: N/A;  830  . FOOT SURGERY  2011  . POLYPECTOMY  06/18/2017   Procedure: POLYPECTOMY;  Surgeon: Rogene Houston, MD;  [facility];  Service: Endoscopy;;  colon  . RECTOCELE REPAIR N/A 04/22/2016   Procedure: POSTERIOR REPAIR (RECTOCELE);  Surgeon: Jonnie Kind, MD;  Location: [facility];  Service: Gynecology;  Laterality: N/A;  . TONSILLECTOMY    . TUBAL LIGATION     Social History   Occupational History  . Not on file.   Social History Main Topics  . Smoking status: Never Smoker  . Smokeless tobacco: Never Used  . Alcohol use No  . Drug use: No  .  Sexual activity: Not Currently    Birth control/ protection: Post-menopausal

## 2017-07-08 ENCOUNTER — Ambulatory Visit (INDEPENDENT_AMBULATORY_CARE_PROVIDER_SITE_OTHER): Payer: BLUE CROSS/BLUE SHIELD | Admitting: Orthopedic Surgery

## 2017-07-08 ENCOUNTER — Ambulatory Visit (INDEPENDENT_AMBULATORY_CARE_PROVIDER_SITE_OTHER): Payer: BLUE CROSS/BLUE SHIELD | Admitting: Orthopaedic Surgery

## 2017-07-08 DIAGNOSIS — M1712 Unilateral primary osteoarthritis, left knee: Secondary | ICD-10-CM | POA: Diagnosis not present

## 2017-07-08 MED ORDER — LIDOCAINE HCL 1 % IJ SOLN
3.0000 mL | INTRAMUSCULAR | Status: AC | PRN
  Administered 2017-07-08: 3 mL

## 2017-07-08 MED ORDER — SODIUM HYALURONATE (VISCOSUP) 20 MG/2ML IX SOSY
20.0000 mg | PREFILLED_SYRINGE | INTRA_ARTICULAR | Status: AC | PRN
  Administered 2017-07-08: 20 mg via INTRA_ARTICULAR

## 2017-07-08 NOTE — Progress Notes (Signed)
Office Visit Note   Patient: Emily Reeves           Date of Birth: February 26, 1956           MRN: [id number] Visit Date: 07/08/2017              Requested by: Asencion Noble, MD [address] [place], [place] [address] PCP: Asencion Noble, MD   Assessment & Plan: Visit Diagnoses:  1. Unilateral primary osteoarthritis, left knee     Plan: second euflexxa inj left knee. F/U 1 week to complete series  Follow-Up Instructions: Return in about 1 week (around 07/15/2017).   Orders:  No orders of the defined types were placed in this encounter.  No orders of the defined types were placed in this encounter.     Procedures: Large Joint Inj Date/Time: 07/08/2017 3:49 PM Performed by: Garald Balding Authorized by: Garald Balding   Consent Given by:  Patient Timeout: prior to procedure the correct patient, procedure, and site was verified   Indications:  Pain and joint swelling Location:  Knee Site:  L knee Prep: patient was prepped and draped in usual sterile fashion   Needle Size:  25 G Needle Length:  1.5 inches Approach:  Anteromedial Ultrasound Guidance: No   Fluoroscopic Guidance: No   Arthrogram: No   Medications:  20 mg Sodium Hyaluronate 20 MG/2ML; 3 mL lidocaine 1 % Aspiration Attempted: No   Patient tolerance:  Patient tolerated the procedure well with no immediate complications     Clinical Data: No additional findings.   Subjective: No chief complaint on file. First euflexxa inj left knee last week in [place]-no change in symptoms. Still "achy"  HPI  Review of Systems   Objective: Vital Signs: There were no vitals taken for this visit.  Physical Exam  Ortho Exam left knee not hot, warm or swollen. Minimal med jt pain. Skin intact  Specialty Comments:  No specialty comments available.  Imaging: No results found.   PMFS History: Patient Active Problem List   Diagnosis Date Noted  . Special screening for malignant neoplasms, colon  02/13/2017  . Posterior vaginal hernia/rectocele 04/22/2016  . Rectocele 04/09/2016  . Anal sphincter incontinence silent 12/25/2015  . Encounter for routine gynecological examination 05/10/2014  . Menopausal symptoms 05/10/2014  . Cervical lesion 05/10/2014  . Obesity (BMI 30-39.9) 05/10/2014  . Menopause present 02/21/2013   Past Medical History:  Diagnosis Date  . Anxiety   . Arthritis   . Diabetes mellitus without complication ([place])   . GERD (gastroesophageal reflux disease)   . History of UTI   . Hyperlipidemia   . Hypertension   . Perimenopausal vasomotor symptoms     Family History  Problem Relation Age of Onset  . Cancer Mother        breast  . Heart disease Mother        angina  . Hypertension Mother   . Heart disease Father        heart attack  . Heart disease Maternal Grandfather        heart attack  . Diabetes Maternal Grandfather   . Hypertension Maternal Aunt   . Heart disease Maternal Aunt   . Colon cancer Neg Hx     Past Surgical History:  Procedure Laterality Date  . APPENDECTOMY    . CESAREAN SECTION    . CHOLECYSTECTOMY    . COLONOSCOPY N/A 06/18/2017   Procedure: COLONOSCOPY;  Surgeon: Rogene Houston, MD;  [facility];  Service: Endoscopy;  Laterality: N/A;  830  . FOOT SURGERY  2011  . POLYPECTOMY  06/18/2017   Procedure: POLYPECTOMY;  Surgeon: Rogene Houston, MD;  [facility];  Service: Endoscopy;;  colon  . RECTOCELE REPAIR N/A 04/22/2016   Procedure: POSTERIOR REPAIR (RECTOCELE);  Surgeon: Jonnie Kind, MD;  Location: [facility];  Service: Gynecology;  Laterality: N/A;  . TONSILLECTOMY    . TUBAL LIGATION     Social History   Occupational History  . Not on file.   Social History Main Topics  . Smoking status: Never Smoker  . Smokeless tobacco: Never Used  . Alcohol use No  . Drug use: No  . Sexual activity: Not Currently    Birth control/ protection: Post-menopausal     Garald Balding, MD   Note -  This record has been created using [employer].  Chart creation errors have been sought, but may not always  have been located. Such creation errors do not reflect on  the standard of medical care.

## 2017-07-15 ENCOUNTER — Ambulatory Visit (INDEPENDENT_AMBULATORY_CARE_PROVIDER_SITE_OTHER): Payer: BLUE CROSS/BLUE SHIELD | Admitting: Orthopedic Surgery

## 2017-07-15 DIAGNOSIS — M1712 Unilateral primary osteoarthritis, left knee: Secondary | ICD-10-CM | POA: Diagnosis not present

## 2017-07-15 MED ORDER — LIDOCAINE HCL 1 % IJ SOLN
3.0000 mL | INTRAMUSCULAR | Status: AC | PRN
  Administered 2017-07-15: 3 mL

## 2017-07-15 MED ORDER — SODIUM HYALURONATE (VISCOSUP) 20 MG/2ML IX SOSY
20.0000 mg | PREFILLED_SYRINGE | INTRA_ARTICULAR | Status: AC | PRN
  Administered 2017-07-15: 20 mg via INTRA_ARTICULAR

## 2017-07-15 NOTE — Progress Notes (Signed)
Office Visit Note   Patient: Emily Reeves           Date of Birth: 07-08-1956           MRN: [id number] Visit Date: 07/15/2017              Requested by: Asencion Noble, MD [address] [place], [place] [address] PCP: Asencion Noble, MD   Assessment & Plan: Visit Diagnoses:  1. Unilateral primary osteoarthritis, left knee     Plan:  #1: Third Euflexa injection was given without difficulty.  #2: Follow back up when necessary  Follow-Up Instructions: Return if symptoms worsen or fail to improve.   Orders:  No orders of the defined types were placed in this encounter.  No orders of the defined types were placed in this encounter.     Procedures: Large Joint Inj Date/Time: 07/15/2017 4:00 PM Performed by: Biagio Borg D Authorized by: Biagio Borg D   Consent Given by:  Patient Timeout: prior to procedure the correct patient, procedure, and site was verified   Indications:  Pain and joint swelling Location:  Knee Site:  L knee Prep: patient was prepped and draped in usual sterile fashion   Needle Size:  25 G Needle Length:  1.5 inches Approach:  Anteromedial Ultrasound Guidance: No   Fluoroscopic Guidance: No   Arthrogram: No   Medications:  20 mg Sodium Hyaluronate 20 MG/2ML; 3 mL lidocaine 1 % Aspiration Attempted: No   Patient tolerance:  Patient tolerated the procedure well with no immediate complications     Clinical Data: No additional findings.   Subjective: No chief complaint on file.   HPI   Emily Reeves returns today for her third Euflexa injection. She  Denies any reactivity. She had some benefit.  Review of Systems   Objective: Vital Signs: There were no vitals taken for this visit.  Physical Exam  Ortho Exam  Exam today reveals the knee to be quite benign. No warmth o erythema.  Specialty Comments:  No specialty comments available.  Imaging: No results found.   PMFS History: Patient Active Problem List   Diagnosis Date  Noted  . Special screening for malignant neoplasms, colon 02/13/2017  . Posterior vaginal hernia/rectocele 04/22/2016  . Rectocele 04/09/2016  . Anal sphincter incontinence silent 12/25/2015  . Encounter for routine gynecological examination 05/10/2014  . Menopausal symptoms 05/10/2014  . Cervical lesion 05/10/2014  . Obesity (BMI 30-39.9) 05/10/2014  . Menopause present 02/21/2013   Past Medical History:  Diagnosis Date  . Anxiety   . Arthritis   . Diabetes mellitus without complication ([place])   . GERD (gastroesophageal reflux disease)   . History of UTI   . Hyperlipidemia   . Hypertension   . Perimenopausal vasomotor symptoms     Family History  Problem Relation Age of Onset  . Cancer Mother        breast  . Heart disease Mother        angina  . Hypertension Mother   . Heart disease Father        heart attack  . Heart disease Maternal Grandfather        heart attack  . Diabetes Maternal Grandfather   . Hypertension Maternal Aunt   . Heart disease Maternal Aunt   . Colon cancer Neg Hx     Past Surgical History:  Procedure Laterality Date  . APPENDECTOMY    . CESAREAN SECTION    . CHOLECYSTECTOMY    . COLONOSCOPY  N/A 06/18/2017   Procedure: COLONOSCOPY;  Surgeon: Rogene Houston, MD;  [facility];  Service: Endoscopy;  Laterality: N/A;  830  . FOOT SURGERY  2011  . POLYPECTOMY  06/18/2017   Procedure: POLYPECTOMY;  Surgeon: Rogene Houston, MD;  [facility];  Service: Endoscopy;;  colon  . RECTOCELE REPAIR N/A 04/22/2016   Procedure: POSTERIOR REPAIR (RECTOCELE);  Surgeon: Jonnie Kind, MD;  Location: [facility];  Service: Gynecology;  Laterality: N/A;  . TONSILLECTOMY    . TUBAL LIGATION     Social History   Occupational History  . Not on file.   Social History Main Topics  . Smoking status: Never Smoker  . Smokeless tobacco: Never Used  . Alcohol use No  . Drug use: No  . Sexual activity: Not Currently    Birth control/  protection: Post-menopausal

## 2017-08-15 DIAGNOSIS — E119 Type 2 diabetes mellitus without complications: Secondary | ICD-10-CM | POA: Diagnosis not present

## 2017-08-21 DIAGNOSIS — Z23 Encounter for immunization: Secondary | ICD-10-CM | POA: Diagnosis not present

## 2017-08-21 DIAGNOSIS — M7580 Other shoulder lesions, unspecified shoulder: Secondary | ICD-10-CM | POA: Diagnosis not present

## 2017-08-21 DIAGNOSIS — E119 Type 2 diabetes mellitus without complications: Secondary | ICD-10-CM | POA: Diagnosis not present

## 2017-08-31 DIAGNOSIS — L600 Ingrowing nail: Secondary | ICD-10-CM | POA: Diagnosis not present

## 2017-08-31 DIAGNOSIS — E1151 Type 2 diabetes mellitus with diabetic peripheral angiopathy without gangrene: Secondary | ICD-10-CM | POA: Diagnosis not present

## 2017-08-31 DIAGNOSIS — E1140 Type 2 diabetes mellitus with diabetic neuropathy, unspecified: Secondary | ICD-10-CM | POA: Diagnosis not present

## 2017-08-31 DIAGNOSIS — M79671 Pain in right foot: Secondary | ICD-10-CM | POA: Diagnosis not present

## 2017-09-17 ENCOUNTER — Other Ambulatory Visit: Payer: Self-pay | Admitting: Obstetrics and Gynecology

## 2017-09-23 ENCOUNTER — Other Ambulatory Visit: Payer: Self-pay | Admitting: *Deleted

## 2017-09-23 MED ORDER — ALPRAZOLAM 0.5 MG PO TABS: ORAL_TABLET | ORAL | 2 refills | Status: DC

## 2017-09-23 NOTE — Telephone Encounter (Signed)
Pt called back and said that the medication was for the generic Xanax.  09-23-17  AS

## 2017-09-23 NOTE — Telephone Encounter (Signed)
Attempted to reach patient to determine which medication she needs refilled. Left message to call us back and let us know what medication is needed.

## 2017-09-28 ENCOUNTER — Other Ambulatory Visit (HOSPITAL_COMMUNITY): Payer: Self-pay | Admitting: Internal Medicine

## 2017-09-28 DIAGNOSIS — Z1231 Encounter for screening mammogram for malignant neoplasm of breast: Secondary | ICD-10-CM

## 2017-09-30 ENCOUNTER — Other Ambulatory Visit: Payer: Self-pay | Admitting: Obstetrics and Gynecology

## 2017-10-05 ENCOUNTER — Encounter (HOSPITAL_COMMUNITY): Payer: Self-pay

## 2017-10-05 ENCOUNTER — Ambulatory Visit (HOSPITAL_COMMUNITY)
Admission: RE | Admit: 2017-10-05 | Discharge: 2017-10-05 | Disposition: A | Discharge status: Home / Self Care | Payer: BLUE CROSS/BLUE SHIELD | Source: Ambulatory Visit | Attending: Internal Medicine | Admitting: Internal Medicine

## 2017-10-05 DIAGNOSIS — Z1231 Encounter for screening mammogram for malignant neoplasm of breast: Secondary | ICD-10-CM | POA: Insufficient documentation

## 2017-10-08 ENCOUNTER — Telehealth: Payer: Self-pay | Admitting: *Deleted

## 2017-10-08 NOTE — Telephone Encounter (Signed)
Attempted to call pt to inform her of normal mammogram. Unable to leave message. Mailbox full

## 2017-11-04 DIAGNOSIS — R05 Cough: Secondary | ICD-10-CM | POA: Diagnosis not present

## 2017-12-19 DIAGNOSIS — E119 Type 2 diabetes mellitus without complications: Secondary | ICD-10-CM | POA: Diagnosis not present

## 2017-12-21 DIAGNOSIS — I10 Essential (primary) hypertension: Secondary | ICD-10-CM | POA: Diagnosis not present

## 2017-12-21 DIAGNOSIS — Z6830 Body mass index (BMI) 30.0-30.9, adult: Secondary | ICD-10-CM | POA: Diagnosis not present

## 2017-12-21 DIAGNOSIS — E119 Type 2 diabetes mellitus without complications: Secondary | ICD-10-CM | POA: Diagnosis not present

## 2018-01-20 DIAGNOSIS — E876 Hypokalemia: Secondary | ICD-10-CM | POA: Diagnosis not present

## 2018-01-25 DIAGNOSIS — Z79899 Other long term (current) drug therapy: Secondary | ICD-10-CM | POA: Diagnosis not present

## 2018-01-25 DIAGNOSIS — E876 Hypokalemia: Secondary | ICD-10-CM | POA: Diagnosis not present

## 2018-03-01 DIAGNOSIS — E1151 Type 2 diabetes mellitus with diabetic peripheral angiopathy without gangrene: Secondary | ICD-10-CM | POA: Diagnosis not present

## 2018-03-01 DIAGNOSIS — E1140 Type 2 diabetes mellitus with diabetic neuropathy, unspecified: Secondary | ICD-10-CM | POA: Diagnosis not present

## 2018-03-22 ENCOUNTER — Encounter: Payer: Self-pay | Admitting: Obstetrics and Gynecology

## 2018-03-22 ENCOUNTER — Other Ambulatory Visit (HOSPITAL_COMMUNITY)
Admission: RE | Admit: 2018-03-22 | Discharge: 2018-03-22 | Disposition: A | Discharge status: Home / Self Care | Payer: BLUE CROSS/BLUE SHIELD | Source: Ambulatory Visit | Attending: Obstetrics and Gynecology | Admitting: Obstetrics and Gynecology

## 2018-03-22 ENCOUNTER — Other Ambulatory Visit: Payer: Self-pay

## 2018-03-22 ENCOUNTER — Encounter (INDEPENDENT_AMBULATORY_CARE_PROVIDER_SITE_OTHER): Payer: Self-pay

## 2018-03-22 ENCOUNTER — Other Ambulatory Visit: Payer: BLUE CROSS/BLUE SHIELD | Admitting: Obstetrics and Gynecology

## 2018-03-22 ENCOUNTER — Ambulatory Visit (INDEPENDENT_AMBULATORY_CARE_PROVIDER_SITE_OTHER): Payer: BLUE CROSS/BLUE SHIELD | Admitting: Obstetrics and Gynecology

## 2018-03-22 VITALS — BP 110/66 | HR 88 | Ht 68.5 in | Wt 196.0 lb

## 2018-03-22 DIAGNOSIS — Z01419 Encounter for gynecological examination (general) (routine) without abnormal findings: Secondary | ICD-10-CM

## 2018-03-22 NOTE — Progress Notes (Signed)
Patient ID: Emily Reeves, female   DOB: [birth year], 62 y.o.   MRN: [id number]  Assessment:  1. Annual Gyn Exam Plan:  1. pap smear done, next pap due 2 years-3 YR 2. return annually or prn 3. Annual mammogram advised after age 55 Subjective:  Emily Reeves is a 62 y.o. female No obstetric history on file. who presents for annual exam. No LMP recorded. Patient is postmenopausal. The patient has no complaints today. Last pap was done on 02/21/2013 and was normal. She denies any spotting, bleeding, fever, chills or any other symptoms or complaints at this time.   The following portions of the patient's history were reviewed and updated as appropriate: allergies, current medications, past family history, past medical history, past social history, past surgical history and problem list. Past Medical History:  Diagnosis Date  . Anxiety   . Arthritis   . Diabetes mellitus without complication ([place])   . GERD (gastroesophageal reflux disease)   . History of UTI   . Hyperlipidemia   . Hypertension   . Perimenopausal vasomotor symptoms     Past Surgical History:  Procedure Laterality Date  . APPENDECTOMY    . CESAREAN SECTION    . CHOLECYSTECTOMY    . COLONOSCOPY N/A 06/18/2017   Procedure: COLONOSCOPY;  Surgeon: Rogene Houston, MD;  [facility];  Service: Endoscopy;  Laterality: N/A;  830  . FOOT SURGERY  2011  . POLYPECTOMY  06/18/2017   Procedure: POLYPECTOMY;  Surgeon: Rogene Houston, MD;  [facility];  Service: Endoscopy;;  colon  . RECTOCELE REPAIR N/A 04/22/2016   Procedure: POSTERIOR REPAIR (RECTOCELE);  Surgeon: Jonnie Kind, MD;  Location: [facility];  Service: Gynecology;  Laterality: N/A;  . ROTATOR CUFF REPAIR    . TONSILLECTOMY    . TUBAL LIGATION       Current Outpatient Medications:  .  ALPRAZolam (XANAX) 0.5 MG tablet, TAKE (1) TABLET BY MOUTH ONCE DAILY AS NEEDED FOR ANXIETY, Disp: 30 tablet, Rfl: 2 .  atorvastatin (LIPITOR) 10 MG tablet, Take  10 mg by mouth every morning. , Disp: , Rfl: 12 .  B Complex Vitamins (VITAMIN B COMPLEX PO), Take 1 tablet by mouth daily., Disp: , Rfl:  .  diclofenac (CATAFLAM) 50 MG tablet, Take 1 tablet (50 mg total) by mouth daily., Disp: , Rfl:  .  esomeprazole (NEXIUM) 40 MG capsule, Take 40 mg by mouth daily., Disp: , Rfl:  .  KLOR-CON M20 20 MEQ tablet, TAKE 1 TAB THREE TIMES A DAY FOR 2 DAYS, THEN 1 TWICE A DAY, Disp: , Rfl: 12 .  loratadine (CLARITIN) 10 MG tablet, Take 10 mg by mouth daily as needed for allergies., Disp: , Rfl:  .  losartan-hydrochlorothiazide (HYZAAR) 100-25 MG tablet, Take 1 tablet by mouth daily., Disp: , Rfl: 4 .  Biotin 1 MG CAPS, Take 1 tablet by mouth daily., Disp: , Rfl:  .  Ginkgo Biloba 40 MG TABS, Take 1 tablet by mouth daily., Disp: , Rfl:   Review of Systems Constitutional: negative Gastrointestinal: negative Genitourinary: negative  Objective:  BP 110/66 (BP Location: Left Arm, Patient Position: Sitting, Cuff Size: Normal)   Pulse 88   Ht 5' 8.5" (1.74 m)   Wt 196 lb (88.9 kg)   BMI 29.37 kg/m    BMI: Body mass index is 29.37 kg/m.  General Appearance: Alert, appropriate appearance for age. No acute distress HEENT: Grossly normal Neck / Thyroid:  Cardiovascular: RRR; normal  S1, S2, no murmur Lungs: CTA bilaterally Back: No CVAT Breast Exam: Normal to inspection, multiple skin hyperpigmentation, all normal, and No masses or nodes.No dimpling, nipple retraction or discharge. Gastrointestinal: Soft, non-tender, no masses or organomegaly Pelvic Exam:  Cervix - slightly everted, tiny  Uterus - retroflexed, normal  Rectovaginal: not indicated and guaiac negative stool obtained Lymphatic Exam: Non-palpable nodes in neck, clavicular, axillary, or inguinal regions Skin: no rash or abnormalities Neurologic: Normal gait and speech, no tremor  Psychiatric: Alert and oriented, appropriate affect.  Urinalysis:Not done  By signing my name below, I, Margit Banda, attest that this documentation has been prepared under the direction and in the presence of Jonnie Kind, MD. Electronically Signed: Margit Banda, Medical Scribe. 03/22/18. 2:32 PM.  I personally performed the services described in this documentation, which was SCRIBED in my presence. The recorded information has been reviewed and considered accurate. It has been edited as necessary during review. Jonnie Kind, MD

## 2018-03-24 LAB — CYTOLOGY - PAP
DIAGNOSIS: NEGATIVE
HPV (WINDOPATH): NOT DETECTED

## 2018-03-31 ENCOUNTER — Telehealth: Payer: Self-pay | Admitting: Obstetrics and Gynecology

## 2018-03-31 MED ORDER — ALPRAZOLAM 0.5 MG PO TABS: ORAL_TABLET | ORAL | 2 refills | Status: DC

## 2018-03-31 NOTE — Telephone Encounter (Signed)
Xanax refil at current once daily dosing escribed.

## 2018-04-17 DIAGNOSIS — I10 Essential (primary) hypertension: Secondary | ICD-10-CM | POA: Diagnosis not present

## 2018-04-17 DIAGNOSIS — K219 Gastro-esophageal reflux disease without esophagitis: Secondary | ICD-10-CM | POA: Diagnosis not present

## 2018-04-17 DIAGNOSIS — Z79899 Other long term (current) drug therapy: Secondary | ICD-10-CM | POA: Diagnosis not present

## 2018-04-17 DIAGNOSIS — E876 Hypokalemia: Secondary | ICD-10-CM | POA: Diagnosis not present

## 2018-04-19 DIAGNOSIS — E119 Type 2 diabetes mellitus without complications: Secondary | ICD-10-CM | POA: Diagnosis not present

## 2018-04-19 DIAGNOSIS — H1045 Other chronic allergic conjunctivitis: Secondary | ICD-10-CM | POA: Diagnosis not present

## 2018-04-19 DIAGNOSIS — H2513 Age-related nuclear cataract, bilateral: Secondary | ICD-10-CM | POA: Diagnosis not present

## 2018-04-22 DIAGNOSIS — E119 Type 2 diabetes mellitus without complications: Secondary | ICD-10-CM | POA: Diagnosis not present

## 2018-04-22 DIAGNOSIS — E876 Hypokalemia: Secondary | ICD-10-CM | POA: Diagnosis not present

## 2018-06-07 DIAGNOSIS — M79671 Pain in right foot: Secondary | ICD-10-CM | POA: Diagnosis not present

## 2018-06-07 DIAGNOSIS — E1151 Type 2 diabetes mellitus with diabetic peripheral angiopathy without gangrene: Secondary | ICD-10-CM | POA: Diagnosis not present

## 2018-06-07 DIAGNOSIS — M79672 Pain in left foot: Secondary | ICD-10-CM | POA: Diagnosis not present

## 2018-06-07 DIAGNOSIS — E1140 Type 2 diabetes mellitus with diabetic neuropathy, unspecified: Secondary | ICD-10-CM | POA: Diagnosis not present

## 2018-07-16 DIAGNOSIS — H04123 Dry eye syndrome of bilateral lacrimal glands: Secondary | ICD-10-CM | POA: Diagnosis not present

## 2018-08-23 ENCOUNTER — Other Ambulatory Visit: Payer: Self-pay | Admitting: Obstetrics and Gynecology

## 2018-08-23 DIAGNOSIS — Z1231 Encounter for screening mammogram for malignant neoplasm of breast: Secondary | ICD-10-CM

## 2018-08-30 DIAGNOSIS — E1151 Type 2 diabetes mellitus with diabetic peripheral angiopathy without gangrene: Secondary | ICD-10-CM | POA: Diagnosis not present

## 2018-08-30 DIAGNOSIS — M79671 Pain in right foot: Secondary | ICD-10-CM | POA: Diagnosis not present

## 2018-08-30 DIAGNOSIS — E119 Type 2 diabetes mellitus without complications: Secondary | ICD-10-CM | POA: Diagnosis not present

## 2018-08-30 DIAGNOSIS — M79672 Pain in left foot: Secondary | ICD-10-CM | POA: Diagnosis not present

## 2018-08-30 DIAGNOSIS — E1140 Type 2 diabetes mellitus with diabetic neuropathy, unspecified: Secondary | ICD-10-CM | POA: Diagnosis not present

## 2018-09-03 DIAGNOSIS — Z23 Encounter for immunization: Secondary | ICD-10-CM | POA: Diagnosis not present

## 2018-09-03 DIAGNOSIS — E1159 Type 2 diabetes mellitus with other circulatory complications: Secondary | ICD-10-CM | POA: Diagnosis not present

## 2018-09-03 DIAGNOSIS — I10 Essential (primary) hypertension: Secondary | ICD-10-CM | POA: Diagnosis not present

## 2018-10-04 ENCOUNTER — Telehealth: Payer: Self-pay | Admitting: Obstetrics and Gynecology

## 2018-10-04 ENCOUNTER — Other Ambulatory Visit: Payer: Self-pay | Admitting: Obstetrics and Gynecology

## 2018-10-04 MED ORDER — ALPRAZOLAM 0.5 MG PO TABS: ORAL_TABLET | ORAL | 1 refills | Status: DC

## 2018-10-04 NOTE — Telephone Encounter (Signed)
Patient called stated that she went to her pharmacy to pick up her medicine and they told her that she waited too long to pick it up and it it would need to be called in again.  [facility]  [phone]

## 2018-10-04 NOTE — Progress Notes (Signed)
Alprazolam 0.5 mg tabs, 30 , with one refil. Pt to use sparingly as always, should last 6 months.

## 2018-10-25 NOTE — Telephone Encounter (Signed)
Trying to close encounter

## 2018-10-25 NOTE — Telephone Encounter (Signed)
Trying to close this encounter.

## 2018-11-10 ENCOUNTER — Ambulatory Visit (HOSPITAL_COMMUNITY)
Admission: RE | Admit: 2018-11-10 | Discharge: 2018-11-10 | Disposition: A | Discharge status: Home / Self Care | Payer: BLUE CROSS/BLUE SHIELD | Source: Ambulatory Visit | Attending: Obstetrics and Gynecology | Admitting: Obstetrics and Gynecology

## 2018-11-10 DIAGNOSIS — Z1231 Encounter for screening mammogram for malignant neoplasm of breast: Secondary | ICD-10-CM | POA: Diagnosis not present

## 2018-11-11 DIAGNOSIS — Q828 Other specified congenital malformations of skin: Secondary | ICD-10-CM | POA: Diagnosis not present

## 2018-11-11 DIAGNOSIS — L110 Acquired keratosis follicularis: Secondary | ICD-10-CM | POA: Diagnosis not present

## 2018-11-11 DIAGNOSIS — M79671 Pain in right foot: Secondary | ICD-10-CM | POA: Diagnosis not present

## 2018-11-11 DIAGNOSIS — E1140 Type 2 diabetes mellitus with diabetic neuropathy, unspecified: Secondary | ICD-10-CM | POA: Diagnosis not present

## 2018-11-26 DIAGNOSIS — J069 Acute upper respiratory infection, unspecified: Secondary | ICD-10-CM | POA: Diagnosis not present

## 2019-01-28 DIAGNOSIS — E1159 Type 2 diabetes mellitus with other circulatory complications: Secondary | ICD-10-CM | POA: Diagnosis not present

## 2019-02-01 DIAGNOSIS — E1140 Type 2 diabetes mellitus with diabetic neuropathy, unspecified: Secondary | ICD-10-CM | POA: Diagnosis not present

## 2019-02-01 DIAGNOSIS — Q828 Other specified congenital malformations of skin: Secondary | ICD-10-CM | POA: Diagnosis not present

## 2019-02-01 DIAGNOSIS — E1159 Type 2 diabetes mellitus with other circulatory complications: Secondary | ICD-10-CM | POA: Diagnosis not present

## 2019-02-01 DIAGNOSIS — I739 Peripheral vascular disease, unspecified: Secondary | ICD-10-CM | POA: Diagnosis not present

## 2019-02-01 DIAGNOSIS — L110 Acquired keratosis follicularis: Secondary | ICD-10-CM | POA: Diagnosis not present

## 2019-02-01 DIAGNOSIS — I10 Essential (primary) hypertension: Secondary | ICD-10-CM | POA: Diagnosis not present

## 2019-04-19 DIAGNOSIS — L110 Acquired keratosis follicularis: Secondary | ICD-10-CM | POA: Diagnosis not present

## 2019-04-19 DIAGNOSIS — E1140 Type 2 diabetes mellitus with diabetic neuropathy, unspecified: Secondary | ICD-10-CM | POA: Diagnosis not present

## 2019-04-19 DIAGNOSIS — Q828 Other specified congenital malformations of skin: Secondary | ICD-10-CM | POA: Diagnosis not present

## 2019-04-19 DIAGNOSIS — I739 Peripheral vascular disease, unspecified: Secondary | ICD-10-CM | POA: Diagnosis not present

## 2019-05-04 DIAGNOSIS — I10 Essential (primary) hypertension: Secondary | ICD-10-CM | POA: Diagnosis not present

## 2019-05-04 DIAGNOSIS — Z79899 Other long term (current) drug therapy: Secondary | ICD-10-CM | POA: Diagnosis not present

## 2019-05-04 DIAGNOSIS — E1159 Type 2 diabetes mellitus with other circulatory complications: Secondary | ICD-10-CM | POA: Diagnosis not present

## 2019-05-09 ENCOUNTER — Telehealth: Payer: Self-pay | Admitting: *Deleted

## 2019-05-09 NOTE — Telephone Encounter (Signed)
Patient called stating she has vaginitis and she has tried everything over the counter, patient denies smell or discharge but it does itch. Patient asked if something can be called in [phone]

## 2019-05-09 NOTE — Telephone Encounter (Signed)
Mail box full @ 3:48 pm. JSY

## 2019-05-10 DIAGNOSIS — E785 Hyperlipidemia, unspecified: Secondary | ICD-10-CM | POA: Diagnosis not present

## 2019-05-10 DIAGNOSIS — E1159 Type 2 diabetes mellitus with other circulatory complications: Secondary | ICD-10-CM | POA: Diagnosis not present

## 2019-05-10 DIAGNOSIS — N390 Urinary tract infection, site not specified: Secondary | ICD-10-CM | POA: Diagnosis not present

## 2019-05-10 DIAGNOSIS — I10 Essential (primary) hypertension: Secondary | ICD-10-CM | POA: Diagnosis not present

## 2019-05-10 NOTE — Telephone Encounter (Addendum)
Spoke with pt. Pt has vaginal dryness. + itching. No discharge and no odor. Has tried OTC meds with no help. Pt is requesting something be called in. Please advise. Thanks!! [place]

## 2019-05-10 NOTE — Telephone Encounter (Signed)
Will try mycolog topical. Pt to f/u with exam if unrelieved.

## 2019-05-11 NOTE — Telephone Encounter (Signed)
Pt aware of Dr. Johnnye Sima recommendations and voiced understanding. [place]

## 2019-05-12 ENCOUNTER — Other Ambulatory Visit: Payer: Self-pay | Admitting: Obstetrics and Gynecology

## 2019-05-12 MED ORDER — NYSTATIN-TRIAMCINOLONE 100000-0.1 UNIT/GM-% EX OINT: 1.0000 "application " | TOPICAL_OINTMENT | Freq: Two times a day (BID) | CUTANEOUS | 99 refills | Status: DC

## 2019-05-12 NOTE — Progress Notes (Signed)
mycolog ordered . refil prn.

## 2019-06-06 ENCOUNTER — Other Ambulatory Visit: Payer: Self-pay | Admitting: *Deleted

## 2019-06-06 MED ORDER — ALPRAZOLAM 0.5 MG PO TABS: ORAL_TABLET | ORAL | 1 refills | Status: DC

## 2019-06-28 ENCOUNTER — Other Ambulatory Visit: Payer: Self-pay | Admitting: Orthopedic Surgery

## 2019-07-15 DIAGNOSIS — M1712 Unilateral primary osteoarthritis, left knee: Secondary | ICD-10-CM | POA: Diagnosis present

## 2019-07-15 NOTE — H&P (Signed)
TOTAL KNEE ADMISSION H&P  Patient is being admitted for left total knee arthroplasty.  Subjective:  Chief Complaint:left knee pain.  HPI: Emily Reeves, 63 y.o. female, has a history of pain and functional disability in the left knee due to trauma and arthritis and has failed non-surgical conservative treatments for greater than 12 weeks to includeNSAID's and/or analgesics, corticosteriod injections, viscosupplementation injections, flexibility and strengthening excercises, use of assistive devices, weight reduction as appropriate and activity modification.  Onset of symptoms was abrupt, starting 2 years ago with rapidlly worsening course since that time. The patient noted prior procedures on the knee to include  arthroscopy on the left knee(s).  Patient currently rates pain in the left knee(s) at 10 out of 10 with activity. Patient has night pain, worsening of pain with activity and weight bearing, pain that interferes with activities of daily living, pain with passive range of motion, crepitus and joint swelling.  Patient has evidence of subchondral sclerosis, periarticular osteophytes, joint subluxation and joint space narrowing by imaging studies.  There is no active infection.  Patient Active Problem List   Diagnosis Date Noted  . Degenerative arthritis of left knee 07/15/2019  . Special screening for malignant neoplasms, colon 02/13/2017  . Posterior vaginal hernia/rectocele 04/22/2016  . Rectocele 04/09/2016  . Anal sphincter incontinence silent 12/25/2015  . Encounter for routine gynecological examination 05/10/2014  . Menopausal symptoms 05/10/2014  . Cervical lesion 05/10/2014  . Obesity (BMI 30-39.9) 05/10/2014  . Menopause present 02/21/2013   Past Medical History:  Diagnosis Date  . Anxiety   . Arthritis   . Diabetes mellitus without complication ([place])   . GERD (gastroesophageal reflux disease)   . History of UTI   . Hyperlipidemia   . Hypertension   . Perimenopausal  vasomotor symptoms     Past Surgical History:  Procedure Laterality Date  . APPENDECTOMY    . CESAREAN SECTION    . CHOLECYSTECTOMY    . COLONOSCOPY N/A 06/18/2017   Procedure: COLONOSCOPY;  Surgeon: Rogene Houston, MD;  Location: [facility];  Service: Endoscopy;  Laterality: N/A;  830  . FOOT SURGERY  2011  . POLYPECTOMY  06/18/2017   Procedure: POLYPECTOMY;  Surgeon: Rogene Houston, MD;  Location: [facility];  Service: Endoscopy;;  colon  . RECTOCELE REPAIR N/A 04/22/2016   Procedure: POSTERIOR REPAIR (RECTOCELE);  Surgeon: Jonnie Kind, MD;  Location: [facility];  Service: Gynecology;  Laterality: N/A;  . ROTATOR CUFF REPAIR    . TONSILLECTOMY    . TUBAL LIGATION      No current facility-administered medications for this encounter.    Current Outpatient Medications  Medication Sig Dispense Refill Last Dose  . ALPRAZolam (XANAX) 0.5 MG tablet TAKE (1) TABLET BY MOUTH ONCE DAILY AS NEEDED FOR ANXIETY 30 tablet 1   . atorvastatin (LIPITOR) 10 MG tablet Take 10 mg by mouth every morning.   12 Taking  . B Complex Vitamins (VITAMIN B COMPLEX PO) Take 1 tablet by mouth daily.   Taking  . Biotin 1 MG CAPS Take 1 tablet by mouth daily.   Not Taking  . diclofenac (CATAFLAM) 50 MG tablet Take 1 tablet (50 mg total) by mouth daily.   Taking  . esomeprazole (NEXIUM) 40 MG capsule Take 40 mg by mouth daily.   Taking  . Ginkgo Biloba 40 MG TABS Take 1 tablet by mouth daily.   Not Taking  . KLOR-CON M20 20 MEQ tablet TAKE 1 TAB THREE TIMES  A DAY FOR 2 DAYS, THEN 1 TWICE A DAY  12 Taking  . loratadine (CLARITIN) 10 MG tablet Take 10 mg by mouth daily as needed for allergies.   Taking  . losartan-hydrochlorothiazide (HYZAAR) 100-25 MG tablet Take 1 tablet by mouth daily.  4 Taking  . nystatin-triamcinolone ointment (MYCOLOG) Apply 1 application topically 2 (two) times daily. To affected area. 30 g prn    No Known Allergies  Social History   Tobacco Use  . Smoking status: Never Smoker  .  Smokeless tobacco: Never Used  Substance Use Topics  . Alcohol use: No    Family History  Problem Relation Age of Onset  . Cancer Mother        breast  . Heart disease Mother        angina  . Hypertension Mother   . Heart disease Father        heart attack  . Heart disease Maternal Grandfather        heart attack  . Diabetes Maternal Grandfather   . Hypertension Maternal Aunt   . Heart disease Maternal Aunt   . Colon cancer Neg Hx      Review of Systems  Constitutional: Negative.   HENT: Positive for sinus pain.   Eyes: Negative.   Respiratory: Negative.   Cardiovascular:       Htn  Gastrointestinal: Negative.   Genitourinary: Negative.   Musculoskeletal: Positive for joint pain.  Skin: Negative.   Neurological: Negative.   Endo/Heme/Allergies: Negative.   Psychiatric/Behavioral: Negative.     Objective:  Physical Exam  Constitutional: She is oriented to person, place, and time. She appears well-developed and well-nourished.  HENT:  Head: Normocephalic and atraumatic.  Eyes: Pupils are equal, round, and reactive to light.  Neck: Normal range of motion. Neck supple.  Cardiovascular: Intact distal pulses.  Respiratory: Effort normal.  Musculoskeletal:        General: Tenderness present.     Comments: There is crepitus as you take her through range of motion from 0-120 with pain on the extremes obvious valgus deformity 1+ effusion tender along the lateral and peripatellar joint lines.  Neurovascular intact distally skin is intact toes are pink and well perfused.  Neurological: She is alert and oriented to person, place, and time.  Skin: Skin is warm and dry.  Psychiatric: She has a normal mood and affect. Her behavior is normal. Judgment and thought content normal.    Vital signs in last 24 hours: BP: ()/()  Arterial Line BP: ()/()   Labs:   Estimated body mass index is 29.37 kg/m as calculated from the following:   Height as of 03/22/18: 5' 8.5" (1.74  m).   Weight as of 03/22/18: 88.9 kg.   Imaging Review Plain radiographs demonstrate [facility] lateral sunrise and Lutricia Feil x-rays continue to show bone-on-bone arthritic changes of the patellofemoral joint with erosion of the patella lateral facet, bone-on-bone lateral compartment with large peripheral osteophytes.   Assessment/Plan:  End stage arthritis, left knee   The patient history, physical examination, clinical judgment of the provider and imaging studies are consistent with end stage degenerative joint disease of the left knee(s) and total knee arthroplasty is deemed medically necessary. The treatment options including medical management, injection therapy arthroscopy and arthroplasty were discussed at length. The risks and benefits of total knee arthroplasty were presented and reviewed. The risks due to aseptic loosening, infection, stiffness, patella tracking problems, thromboembolic complications and other imponderables were discussed. The patient acknowledged the  explanation, agreed to proceed with the plan and consent was signed. Patient is being admitted for inpatient treatment for surgery, pain control, PT, OT, prophylactic antibiotics, VTE prophylaxis, progressive ambulation and ADL's and discharge planning. The patient is planning to be discharged home with home health services     Patient's anticipated LOS is less than 2 midnights, meeting these requirements: - Younger than 8 - Lives within 1 hour of care - Has a competent adult at home to recover with post-op recover - NO history of  - Chronic pain requiring opiods  - Diabetes  - Coronary Artery Disease  - Heart failure  - Heart attack  - Stroke  - DVT/VTE  - Cardiac arrhythmia  - Respiratory Failure/COPD  - Renal failure  - Anemia  - Advanced Liver disease

## 2019-07-19 DIAGNOSIS — L110 Acquired keratosis follicularis: Secondary | ICD-10-CM | POA: Diagnosis not present

## 2019-07-19 DIAGNOSIS — Q828 Other specified congenital malformations of skin: Secondary | ICD-10-CM | POA: Diagnosis not present

## 2019-07-19 DIAGNOSIS — I739 Peripheral vascular disease, unspecified: Secondary | ICD-10-CM | POA: Diagnosis not present

## 2019-07-19 DIAGNOSIS — E1140 Type 2 diabetes mellitus with diabetic neuropathy, unspecified: Secondary | ICD-10-CM | POA: Diagnosis not present

## 2019-07-19 NOTE — Patient Instructions (Addendum)
DUE TO COVID-19 ONLY ONE VISITOR IS ALLOWED TO COME WITH YOU AND STAY IN THE WAITING ROOM ONLY DURING PRE OP AND PROCEDURE DAY OF SURGERY. THE 1 VISITOR MAY VISIT WITH YOU AFTER SURGERY IN YOUR PRIVATE ROOM DURING VISITING HOURS ONLY! 9/17 YOU NEED TO HAVE A COVID 19 TEST ON__thurs 9/17_____ @__11 :05_____, THIS TEST MUST BE DONE BEFORE SURGERY, COME  [place], [address] , [id number].  ([place])  ONCE YOUR COVID TEST IS COMPLETED, PLEASE BEGIN THE QUARANTINE INSTRUCTIONS AS OUTLINED IN YOUR HANDOUT.                Emily Reeves   Your procedure is scheduled on: Monday 07/25/19   Report to [facility]  Entrance   Report to admitting at  9:25 AM     Call this number if you have problems the morning of surgery [place], [place].   Do not eat food After Midnight.   YOU MAY HAVE CLEAR LIQUIDS FROM MIDNIGHT UNTIL 8:30AM.  At 8:30AM Please finish the prescribed Pre-Surgery Gatorade drink.  Nothing by mouth after you finish the Gatorade drink !   Take these medicines the morning of surgery with A SIP OF WATER: Voltaren, Nexium  DO NOT TAKE ANY DIABETIC MEDICATIONS DAY OF YOUR SURGERY                               You may not have any metal on your body including hair pins and              piercings              Do not wear jewelry, make-up, lotions, powders or perfumes, deodorant             Do not wear nail polish.  Do not shave  48 hours prior to surgery.             Do not bring valuables to the hospital. [place].  Contacts, dentures or bridgework may not be worn into surgery.       Patients discharged the day of surgery will not be allowed to drive home.  IF YOU ARE HAVING SURGERY AND GOING HOME THE SAME DAY, YOU MUST HAVE AN ADULT TO DRIVE YOU HOME AND BE WITH YOU FOR 24 HOURS.  YOU MAY GO HOME BY  TAXI OR UBER OR ORTHERWISE, BUT AN ADULT MUST ACCOMPANY YOU HOME AND STAY WITH YOU FOR 24 HOURS.  Name and phone number of your driver:  Special Instructions: N/A              Please read over the following fact sheets you were given: _____________________________________________________________________             [facility] - Preparing for Surgery  Before surgery, you can play an important role.   Because skin is not sterile, your skin needs to be as free of germs as possible.   You can reduce the number of germs on your skin by washing with CHG (chlorahexidine gluconate) soap before surgery.  CHG is an antiseptic cleaner which kills germs and bonds with the skin to continue killing germs even after washing. Please DO NOT  use if you have an allergy to CHG or antibacterial soaps.   If your skin becomes reddened/irritated stop using the CHG and inform your nurse when you arrive at Short Stay. Do not shave (including legs and underarms) for at least 48 hours prior to the first CHG shower.   Please follow these instructions carefully:   1.  Shower with CHG Soap the night before surgery and the  morning of Surgery.  2.  If you choose to wash your hair, wash your hair first as usual with your  normal  shampoo.  3.  After you shampoo, rinse your hair and body thoroughly to remove the  shampoo.                                        4.  Use CHG as you would any other liquid soap.  You can apply chg directly  to the skin and wash                       Gently with a scrungie or clean washcloth.  5.  Apply the CHG Soap to your body ONLY FROM THE NECK DOWN.   Do not use on face/ open                           Wound or open sores. Avoid contact with eyes, ears mouth and genitals (private parts).                       Wash face,  Genitals (private parts) with your normal soap.             6.  Wash thoroughly, paying special attention to the area where your surgery  will be performed.  7.   Thoroughly rinse your body with warm water from the neck down.  8.  DO NOT shower/wash with your normal soap after using and rinsing off  the CHG Soap.                9.  Pat yourself dry with a clean towel.            10.  Wear clean pajamas.            11.  Place clean sheets on your bed the night of your first shower and do not  sleep with pets. Day of Surgery : Do not apply any lotions/deodorants the morning of surgery.  Please wear clean clothes to the hospital/surgery center.  FAILURE TO FOLLOW THESE INSTRUCTIONS MAY RESULT IN THE CANCELLATION OF YOUR SURGERY PATIENT SIGNATURE_________________________________  NURSE SIGNATURE__________________________________  ________________________________________________________________________   Adam Phenix  An incentive spirometer is a tool that can help keep your lungs clear and active. This tool measures how well you are filling your lungs with each breath. Taking long deep breaths may help reverse or decrease the chance of developing breathing (pulmonary) problems (especially infection) following:  A long period of time when you are unable to move or be active. BEFORE THE PROCEDURE   If the spirometer includes an indicator to show your best effort, your nurse or respiratory therapist will set it to a desired goal.  If possible, sit up straight or lean slightly forward. Try not to slouch.  Hold the incentive spirometer in an upright position. INSTRUCTIONS FOR USE  1. Sit on the edge of your bed if possible, or sit up as far as you can in bed or on a chair. 2. Hold the incentive spirometer in an upright position. 3. Breathe out normally. 4. Place the mouthpiece in your mouth and seal your lips tightly around it. 5. Breathe in slowly and as deeply as possible, raising the piston or the ball toward the top of the column. 6. Hold your breath for 3-5 seconds or for as long as possible. Allow the piston or ball to fall to the bottom  of the column. 7. Remove the mouthpiece from your mouth and breathe out normally. 8. Rest for a few seconds and repeat Steps 1 through 7 at least 10 times every 1-2 hours when you are awake. Take your time and take a few normal breaths between deep breaths. 9. The spirometer may include an indicator to show your best effort. Use the indicator as a goal to work toward during each repetition. 10. After each set of 10 deep breaths, practice coughing to be sure your lungs are clear. If you have an incision (the cut made at the time of surgery), support your incision when coughing by placing a pillow or rolled up towels firmly against it. Once you are able to get out of bed, walk around indoors and cough well. You may stop using the incentive spirometer when instructed by your caregiver.  RISKS AND COMPLICATIONS  Take your time so you do not get dizzy or light-headed.  If you are in pain, you may need to take or ask for pain medication before doing incentive spirometry. It is harder to take a deep breath if you are having pain. AFTER USE  Rest and breathe slowly and easily.  It can be helpful to keep track of a log of your progress. Your caregiver can provide you with a simple table to help with this. If you are using the spirometer at home, follow these instructions: [place] IF:   You are having difficultly using the spirometer.  You have trouble using the spirometer as often as instructed.  Your pain medication is not giving enough relief while using the spirometer.  You develop fever of 100.5 F (38.1 C) or higher. SEEK IMMEDIATE MEDICAL CARE IF:   You cough up bloody sputum that had not been present before.  You develop fever of 102 F (38.9 C) or greater.  You develop worsening pain at or near the incision site. MAKE SURE YOU:   Understand these instructions.  Will watch your condition.  Will get help right away if you are not doing well or get worse. Document  Released: 03/02/2007 Document Revised: 01/12/2012 Document Reviewed: 05/03/2007 [facility] Patient Information 2014 [place], [place].   ________________________________________________________________________

## 2019-07-20 ENCOUNTER — Other Ambulatory Visit: Payer: Self-pay

## 2019-07-20 ENCOUNTER — Encounter (HOSPITAL_COMMUNITY)
Admission: RE | Admit: 2019-07-20 | Discharge: 2019-07-20 | Disposition: A | Discharge status: Home / Self Care | Payer: No Typology Code available for payment source | Source: Ambulatory Visit | Attending: Orthopedic Surgery | Admitting: Orthopedic Surgery

## 2019-07-20 ENCOUNTER — Encounter (HOSPITAL_COMMUNITY): Payer: Self-pay

## 2019-07-20 ENCOUNTER — Ambulatory Visit (HOSPITAL_COMMUNITY)
Admission: RE | Admit: 2019-07-20 | Discharge: 2019-07-20 | Disposition: A | Discharge status: Home / Self Care | Payer: No Typology Code available for payment source | Source: Ambulatory Visit | Attending: Orthopedic Surgery | Admitting: Orthopedic Surgery

## 2019-07-20 DIAGNOSIS — Z01818 Encounter for other preprocedural examination: Secondary | ICD-10-CM | POA: Insufficient documentation

## 2019-07-20 DIAGNOSIS — M1712 Unilateral primary osteoarthritis, left knee: Secondary | ICD-10-CM | POA: Insufficient documentation

## 2019-07-20 HISTORY — DX: Other complications of anesthesia, initial encounter: T88.59XA

## 2019-07-20 HISTORY — DX: Nausea with vomiting, unspecified: R11.2

## 2019-07-20 HISTORY — DX: Other specified postprocedural states: Z98.890

## 2019-07-20 LAB — BASIC METABOLIC PANEL
Anion gap: 10 (ref 5–15)
BUN: 19 mg/dL (ref 8–23)
CO2: 25 mmol/L (ref 22–32)
Calcium: 9.6 mg/dL (ref 8.9–10.3)
Chloride: 104 mmol/L (ref 98–111)
Creatinine, Ser: 0.87 mg/dL (ref 0.44–1.00)
GFR calc Af Amer: >60 mL/min (ref >60)
GFR calc non Af Amer: >60 mL/min (ref >60)
Glucose, Bld: 93 mg/dL (ref 70–99)
Potassium: 3.6 mmol/L (ref 3.5–5.1)
Sodium: 139 mmol/L (ref 135–145)

## 2019-07-20 LAB — CBC WITH DIFFERENTIAL/PLATELET
Abs Immature Granulocytes: 0.01 10*3/uL (ref 0.00–0.07)
Basophils Absolute: 0.1 10*3/uL (ref 0.0–0.1)
Basophils Relative: 1 %
Eosinophils Absolute: 0.4 10*3/uL (ref 0.0–0.5)
Eosinophils Relative: 5 %
HCT: 37.8 % (ref 36.0–46.0)
Hemoglobin: 11.6 g/dL — ABNORMAL LOW (ref 12.0–15.0)
Immature Granulocytes: 0 %
Lymphocytes Relative: 36 %
Lymphs Abs: 2.9 10*3/uL (ref 0.7–4.0)
MCH: 25.4 pg — ABNORMAL LOW (ref 26.0–34.0)
MCHC: 30.7 g/dL (ref 30.0–36.0)
MCV: 82.9 fL (ref 80.0–100.0)
Monocytes Absolute: 0.7 10*3/uL (ref 0.1–1.0)
Monocytes Relative: 9 %
Neutro Abs: 3.9 10*3/uL (ref 1.7–7.7)
Neutrophils Relative %: 49 %
Platelets: 256 10*3/uL (ref 150–400)
RBC: 4.56 MIL/uL (ref 3.87–5.11)
RDW: 13.6 % (ref 11.5–15.5)
WBC: 8 10*3/uL (ref 4.0–10.5)
nRBC: 0 % (ref 0.0–0.2)

## 2019-07-20 LAB — URINALYSIS, ROUTINE W REFLEX MICROSCOPIC
Bilirubin Urine: NEGATIVE
Glucose, UA: NEGATIVE mg/dL
Ketones, ur: NEGATIVE mg/dL
Leukocytes,Ua: NEGATIVE
Nitrite: NEGATIVE
Protein, ur: NEGATIVE mg/dL
Specific Gravity, Urine: 1.015 (ref 1.005–1.030)
pH: 6 (ref 5.0–8.0)

## 2019-07-20 LAB — PROTIME-INR
INR: 1.1 (ref 0.8–1.2)
Prothrombin Time: 13.9 seconds (ref 11.4–15.2)

## 2019-07-20 LAB — ABO/RH: ABO/RH(D): AB POS

## 2019-07-20 LAB — GLUCOSE, CAPILLARY: Glucose-Capillary: 124 mg/dL — ABNORMAL HIGH (ref 70–99)

## 2019-07-20 LAB — SURGICAL PCR SCREEN
MRSA, PCR: NEGATIVE
Staphylococcus aureus: NEGATIVE

## 2019-07-20 LAB — APTT: aPTT: 29 seconds (ref 24–36)

## 2019-07-20 LAB — URINALYSIS, MICROSCOPIC (REFLEX)

## 2019-07-20 NOTE — Progress Notes (Signed)
PCP - Dr right. Morrison Cardiologist - none  Chest x-ray - 07/20/19 EKG - 07/20/19 Stress Test - no ECHO - no Cardiac Cath - no  Sleep Study - yes doesn't has sleep apnea CPAP - no  Fasting Blood Sugar - 100-110 Checks Blood Sugar __1___ times a day  Blood Thinner Instructions: Aspirin Instructions:Stop 9/13 instructed by Dr. Willey Blade Last Dose:(/13/20  Anesthesia review:   Patient denies shortness of breath, fever, cough and chest pain at PAT appointment yes  Patient verbalized understanding of instructions that were given to them at the PAT appointment. Patient was also instructed that they will need to review over the PAT instructions again at home before surgery. yes

## 2019-07-21 ENCOUNTER — Other Ambulatory Visit (HOSPITAL_COMMUNITY)
Admission: RE | Admit: 2019-07-21 | Discharge: 2019-07-21 | Disposition: A | Discharge status: Home / Self Care | Payer: BC Managed Care – PPO | Source: Ambulatory Visit | Attending: Orthopedic Surgery | Admitting: Orthopedic Surgery

## 2019-07-21 DIAGNOSIS — Z20828 Contact with and (suspected) exposure to other viral communicable diseases: Secondary | ICD-10-CM | POA: Insufficient documentation

## 2019-07-21 DIAGNOSIS — Z01812 Encounter for preprocedural laboratory examination: Secondary | ICD-10-CM | POA: Insufficient documentation

## 2019-07-21 LAB — HEMOGLOBIN A1C
Hgb A1c MFr Bld: 6.8 % — ABNORMAL HIGH (ref 4.8–5.6)
Mean Plasma Glucose: 148 mg/dL

## 2019-07-22 LAB — NOVEL CORONAVIRUS, NAA (HOSP ORDER, SEND-OUT TO REF LAB; TAT 18-24 HRS): SARS-CoV-2, NAA: NOT DETECTED

## 2019-07-24 MED ORDER — BUPIVACAINE LIPOSOME 1.3 % IJ SUSP
20.0000 mL | Freq: Once | INTRAMUSCULAR | Status: DC
  Filled 2019-07-24: qty 20

## 2019-07-24 MED ORDER — TRANEXAMIC ACID 1000 MG/10ML IV SOLN
2000.0000 mg | INTRAVENOUS | Status: DC
  Filled 2019-07-24: qty 20

## 2019-07-25 ENCOUNTER — Ambulatory Visit (HOSPITAL_COMMUNITY): Payer: No Typology Code available for payment source | Admitting: Physician Assistant

## 2019-07-25 ENCOUNTER — Other Ambulatory Visit: Payer: Self-pay

## 2019-07-25 ENCOUNTER — Encounter (HOSPITAL_COMMUNITY)
Admission: RE | Disposition: A | Discharge status: Home / Self Care | Payer: Self-pay | Source: Ambulatory Visit | Attending: Orthopedic Surgery

## 2019-07-25 ENCOUNTER — Ambulatory Visit (HOSPITAL_COMMUNITY): Payer: No Typology Code available for payment source | Admitting: Anesthesiology

## 2019-07-25 ENCOUNTER — Encounter (HOSPITAL_COMMUNITY): Payer: Self-pay

## 2019-07-25 ENCOUNTER — Observation Stay (HOSPITAL_COMMUNITY)
Admission: RE | Admit: 2019-07-25 | Discharge: 2019-07-26 | Disposition: A | Discharge status: Home / Self Care | Payer: No Typology Code available for payment source | Source: Ambulatory Visit | Attending: Orthopedic Surgery | Admitting: Orthopedic Surgery

## 2019-07-25 DIAGNOSIS — F419 Anxiety disorder, unspecified: Secondary | ICD-10-CM | POA: Insufficient documentation

## 2019-07-25 DIAGNOSIS — K219 Gastro-esophageal reflux disease without esophagitis: Secondary | ICD-10-CM | POA: Diagnosis not present

## 2019-07-25 DIAGNOSIS — Z96652 Presence of left artificial knee joint: Secondary | ICD-10-CM

## 2019-07-25 DIAGNOSIS — I10 Essential (primary) hypertension: Secondary | ICD-10-CM | POA: Insufficient documentation

## 2019-07-25 DIAGNOSIS — Z79899 Other long term (current) drug therapy: Secondary | ICD-10-CM | POA: Diagnosis not present

## 2019-07-25 DIAGNOSIS — Z6828 Body mass index (BMI) 28.0-28.9, adult: Secondary | ICD-10-CM | POA: Insufficient documentation

## 2019-07-25 DIAGNOSIS — Z7984 Long term (current) use of oral hypoglycemic drugs: Secondary | ICD-10-CM | POA: Insufficient documentation

## 2019-07-25 DIAGNOSIS — Z8249 Family history of ischemic heart disease and other diseases of the circulatory system: Secondary | ICD-10-CM | POA: Diagnosis not present

## 2019-07-25 DIAGNOSIS — M1712 Unilateral primary osteoarthritis, left knee: Principal | ICD-10-CM | POA: Diagnosis present

## 2019-07-25 DIAGNOSIS — Z833 Family history of diabetes mellitus: Secondary | ICD-10-CM | POA: Diagnosis not present

## 2019-07-25 DIAGNOSIS — E119 Type 2 diabetes mellitus without complications: Secondary | ICD-10-CM | POA: Insufficient documentation

## 2019-07-25 DIAGNOSIS — E785 Hyperlipidemia, unspecified: Secondary | ICD-10-CM | POA: Insufficient documentation

## 2019-07-25 DIAGNOSIS — M25762 Osteophyte, left knee: Secondary | ICD-10-CM | POA: Diagnosis not present

## 2019-07-25 HISTORY — PX: TOTAL KNEE ARTHROPLASTY: SHX125

## 2019-07-25 LAB — TYPE AND SCREEN
ABO/RH(D): AB POS
Antibody Screen: NEGATIVE

## 2019-07-25 LAB — HEMOGLOBIN A1C
Hgb A1c MFr Bld: 6.6 % — ABNORMAL HIGH (ref 4.8–5.6)
Mean Plasma Glucose: 142.72 mg/dL

## 2019-07-25 LAB — GLUCOSE, CAPILLARY
Glucose-Capillary: 89 mg/dL (ref 70–99)
Glucose-Capillary: 98 mg/dL (ref 70–99)
Glucose-Capillary: 134 mg/dL — ABNORMAL HIGH (ref 70–99)
Glucose-Capillary: 224 mg/dL — ABNORMAL HIGH (ref 70–99)

## 2019-07-25 SURGERY — ARTHROPLASTY, KNEE, TOTAL
Anesthesia: General | Site: Knee | Laterality: Left

## 2019-07-25 MED ORDER — LOSARTAN POTASSIUM 50 MG PO TABS
50.0000 mg | ORAL_TABLET | Freq: Every day | ORAL | Status: DC
  Administered 2019-07-25 – 2019-07-26 (×2): 50 mg via ORAL
  Filled 2019-07-25 (×2): qty 1

## 2019-07-25 MED ORDER — PROPOFOL 10 MG/ML IV BOLUS
INTRAVENOUS | Status: AC
  Filled 2019-07-25: qty 20

## 2019-07-25 MED ORDER — PANTOPRAZOLE SODIUM 40 MG PO TBEC
40.0000 mg | DELAYED_RELEASE_TABLET | Freq: Every day | ORAL | Status: DC
  Administered 2019-07-25 – 2019-07-26 (×2): 40 mg via ORAL
  Filled 2019-07-25: qty 1

## 2019-07-25 MED ORDER — BUPIVACAINE HCL (PF) 0.25 % IJ SOLN
INTRAMUSCULAR | Status: DC | PRN
  Administered 2019-07-25: 30 mL

## 2019-07-25 MED ORDER — ALPRAZOLAM 0.5 MG PO TABS: 0.5000 mg | ORAL_TABLET | Freq: Every day | ORAL | Status: DC | PRN

## 2019-07-25 MED ORDER — PROMETHAZINE HCL 25 MG/ML IJ SOLN: 6.2500 mg | INTRAMUSCULAR | Status: DC | PRN

## 2019-07-25 MED ORDER — ASPIRIN 81 MG PO CHEW
81.0000 mg | CHEWABLE_TABLET | Freq: Two times a day (BID) | ORAL | Status: DC
  Administered 2019-07-25 – 2019-07-26 (×2): 81 mg via ORAL
  Filled 2019-07-25 (×2): qty 1

## 2019-07-25 MED ORDER — HYDROCHLOROTHIAZIDE 25 MG PO TABS
25.0000 mg | ORAL_TABLET | Freq: Every day | ORAL | Status: DC
  Administered 2019-07-25 – 2019-07-26 (×2): 25 mg via ORAL
  Filled 2019-07-25 (×2): qty 1

## 2019-07-25 MED ORDER — TRANEXAMIC ACID-NACL 1000-0.7 MG/100ML-% IV SOLN
1000.0000 mg | INTRAVENOUS | Status: AC
  Administered 2019-07-25: 13:00:00 1000 mg via INTRAVENOUS

## 2019-07-25 MED ORDER — ONDANSETRON HCL 4 MG/2ML IJ SOLN: 4.0000 mg | Freq: Four times a day (QID) | INTRAMUSCULAR | Status: DC | PRN

## 2019-07-25 MED ORDER — PANTOPRAZOLE SODIUM 40 MG PO TBEC: 80.0000 mg | DELAYED_RELEASE_TABLET | Freq: Every day | ORAL | Status: DC

## 2019-07-25 MED ORDER — DEXAMETHASONE SODIUM PHOSPHATE 10 MG/ML IJ SOLN
INTRAMUSCULAR | Status: AC
  Filled 2019-07-25: qty 1

## 2019-07-25 MED ORDER — TRANEXAMIC ACID 1000 MG/10ML IV SOLN
INTRAVENOUS | Status: DC | PRN
  Administered 2019-07-25: 13:00:00 2000 mg via TOPICAL

## 2019-07-25 MED ORDER — SCOPOLAMINE 1 MG/3DAYS TD PT72
MEDICATED_PATCH | TRANSDERMAL | Status: AC
  Filled 2019-07-25: qty 1

## 2019-07-25 MED ORDER — SODIUM CHLORIDE (PF) 0.9 % IJ SOLN
INTRAMUSCULAR | Status: AC
  Filled 2019-07-25: qty 20

## 2019-07-25 MED ORDER — FENTANYL CITRATE (PF) 250 MCG/5ML IJ SOLN
INTRAMUSCULAR | Status: DC | PRN
  Administered 2019-07-25: 50 ug via INTRAVENOUS
  Administered 2019-07-25: 100 ug via INTRAVENOUS
  Administered 2019-07-25: 50 ug via INTRAVENOUS
  Administered 2019-07-25: 100 ug via INTRAVENOUS
  Administered 2019-07-25: 50 ug via INTRAVENOUS

## 2019-07-25 MED ORDER — INSULIN ASPART 100 UNIT/ML ~~LOC~~ SOLN
0.0000 [IU] | Freq: Three times a day (TID) | SUBCUTANEOUS | Status: DC
  Administered 2019-07-26: 2 [IU] via SUBCUTANEOUS

## 2019-07-25 MED ORDER — POTASSIUM CHLORIDE CRYS ER 20 MEQ PO TBCR
20.0000 meq | EXTENDED_RELEASE_TABLET | Freq: Every day | ORAL | Status: DC
  Administered 2019-07-25 – 2019-07-26 (×2): 20 meq via ORAL
  Filled 2019-07-25 (×2): qty 1

## 2019-07-25 MED ORDER — VITAMIN D 25 MCG (1000 UNIT) PO TABS
1000.0000 [IU] | ORAL_TABLET | Freq: Every day | ORAL | Status: DC
  Administered 2019-07-26: 1000 [IU] via ORAL
  Filled 2019-07-25: qty 1

## 2019-07-25 MED ORDER — DIPHENHYDRAMINE HCL 12.5 MG/5ML PO ELIX: 12.5000 mg | ORAL_SOLUTION | ORAL | Status: DC | PRN

## 2019-07-25 MED ORDER — MENTHOL 3 MG MT LOZG: 1.0000 | LOZENGE | OROMUCOSAL | Status: DC | PRN

## 2019-07-25 MED ORDER — LIDOCAINE 2% (20 MG/ML) 5 ML SYRINGE
INTRAMUSCULAR | Status: AC
  Filled 2019-07-25: qty 5

## 2019-07-25 MED ORDER — SUGAMMADEX SODIUM 200 MG/2ML IV SOLN
INTRAVENOUS | Status: DC | PRN
  Administered 2019-07-25: 200 mg via INTRAVENOUS

## 2019-07-25 MED ORDER — KCL IN DEXTROSE-NACL 20-5-0.45 MEQ/L-%-% IV SOLN
INTRAVENOUS | Status: DC
  Administered 2019-07-25 – 2019-07-26 (×2): via INTRAVENOUS
  Filled 2019-07-25 (×3): qty 1000

## 2019-07-25 MED ORDER — TIZANIDINE HCL 2 MG PO TABS: 2.0000 mg | ORAL_TABLET | Freq: Four times a day (QID) | ORAL | 0 refills | Status: DC | PRN

## 2019-07-25 MED ORDER — ALUM & MAG HYDROXIDE-SIMETH 200-200-20 MG/5ML PO SUSP: 30.0000 mL | ORAL | Status: DC | PRN

## 2019-07-25 MED ORDER — PROPOFOL 10 MG/ML IV BOLUS
INTRAVENOUS | Status: AC
  Filled 2019-07-25: qty 60

## 2019-07-25 MED ORDER — ACETAMINOPHEN 325 MG PO TABS: 325.0000 mg | ORAL_TABLET | Freq: Four times a day (QID) | ORAL | Status: DC | PRN

## 2019-07-25 MED ORDER — FENTANYL CITRATE (PF) 100 MCG/2ML IJ SOLN
INTRAMUSCULAR | Status: AC
  Filled 2019-07-25: qty 2

## 2019-07-25 MED ORDER — DEXAMETHASONE SODIUM PHOSPHATE 10 MG/ML IJ SOLN: 10.0000 mg | Freq: Once | INTRAMUSCULAR | Status: DC

## 2019-07-25 MED ORDER — MIDAZOLAM HCL 2 MG/2ML IJ SOLN
1.0000 mg | INTRAMUSCULAR | Status: DC
  Administered 2019-07-25: 2 mg via INTRAVENOUS
  Filled 2019-07-25: qty 2

## 2019-07-25 MED ORDER — BUPIVACAINE HCL (PF) 0.25 % IJ SOLN
INTRAMUSCULAR | Status: AC
  Filled 2019-07-25: qty 30

## 2019-07-25 MED ORDER — ONDANSETRON HCL 4 MG/2ML IJ SOLN
INTRAMUSCULAR | Status: AC
  Filled 2019-07-25: qty 2

## 2019-07-25 MED ORDER — PHENOL 1.4 % MT LIQD: 1.0000 | OROMUCOSAL | Status: DC | PRN

## 2019-07-25 MED ORDER — METOCLOPRAMIDE HCL 5 MG/ML IJ SOLN: 5.0000 mg | Freq: Three times a day (TID) | INTRAMUSCULAR | Status: DC | PRN

## 2019-07-25 MED ORDER — ONDANSETRON HCL 4 MG/2ML IJ SOLN
INTRAMUSCULAR | Status: DC | PRN
  Administered 2019-07-25: 4 mg via INTRAVENOUS

## 2019-07-25 MED ORDER — CEFAZOLIN SODIUM-DEXTROSE 2-4 GM/100ML-% IV SOLN
INTRAVENOUS | Status: AC
  Filled 2019-07-25: qty 100

## 2019-07-25 MED ORDER — LIDOCAINE HCL (CARDIAC) PF 100 MG/5ML IV SOSY
PREFILLED_SYRINGE | INTRAVENOUS | Status: DC | PRN
  Administered 2019-07-25: 60 mg via INTRAVENOUS

## 2019-07-25 MED ORDER — PROPOFOL 10 MG/ML IV BOLUS
INTRAVENOUS | Status: DC | PRN
  Administered 2019-07-25: 150 mg via INTRAVENOUS
  Administered 2019-07-25: 30 mg via INTRAVENOUS

## 2019-07-25 MED ORDER — OXYCODONE-ACETAMINOPHEN 5-325 MG PO TABS: 1.0000 | ORAL_TABLET | ORAL | 0 refills | Status: DC | PRN

## 2019-07-25 MED ORDER — CEFAZOLIN SODIUM-DEXTROSE 2-4 GM/100ML-% IV SOLN
2.0000 g | INTRAVENOUS | Status: AC
  Administered 2019-07-25: 2 g via INTRAVENOUS

## 2019-07-25 MED ORDER — SODIUM CHLORIDE (PF) 0.9 % IJ SOLN
INTRAMUSCULAR | Status: DC | PRN
  Administered 2019-07-25: 70 mL

## 2019-07-25 MED ORDER — SODIUM CHLORIDE (PF) 0.9 % IJ SOLN
INTRAMUSCULAR | Status: AC
  Filled 2019-07-25: qty 50

## 2019-07-25 MED ORDER — ROPIVACAINE HCL 7.5 MG/ML IJ SOLN
INTRAMUSCULAR | Status: DC | PRN
  Administered 2019-07-25: 20 mL via PERINEURAL

## 2019-07-25 MED ORDER — BISACODYL 5 MG PO TBEC: 5.0000 mg | DELAYED_RELEASE_TABLET | Freq: Every day | ORAL | Status: DC | PRN

## 2019-07-25 MED ORDER — DOCUSATE SODIUM 100 MG PO CAPS
100.0000 mg | ORAL_CAPSULE | Freq: Two times a day (BID) | ORAL | Status: DC
  Administered 2019-07-25 – 2019-07-26 (×2): 100 mg via ORAL
  Filled 2019-07-25 (×2): qty 1

## 2019-07-25 MED ORDER — GABAPENTIN 300 MG PO CAPS
300.0000 mg | ORAL_CAPSULE | Freq: Three times a day (TID) | ORAL | Status: DC
  Administered 2019-07-25 – 2019-07-26 (×3): 300 mg via ORAL
  Filled 2019-07-25 (×3): qty 1

## 2019-07-25 MED ORDER — BUPIVACAINE LIPOSOME 1.3 % IJ SUSP
INTRAMUSCULAR | Status: DC | PRN
  Administered 2019-07-25: 20 mL

## 2019-07-25 MED ORDER — CHLORHEXIDINE GLUCONATE 4 % EX LIQD: 60.0000 mL | Freq: Once | CUTANEOUS | Status: DC

## 2019-07-25 MED ORDER — METFORMIN HCL 500 MG PO TABS
500.0000 mg | ORAL_TABLET | Freq: Two times a day (BID) | ORAL | Status: DC
  Filled 2019-07-25 (×2): qty 1

## 2019-07-25 MED ORDER — FENTANYL CITRATE (PF) 100 MCG/2ML IJ SOLN
50.0000 ug | INTRAMUSCULAR | Status: DC
  Administered 2019-07-25: 50 ug via INTRAVENOUS
  Filled 2019-07-25: qty 2

## 2019-07-25 MED ORDER — HYDROMORPHONE HCL 1 MG/ML IJ SOLN
0.2500 mg | INTRAMUSCULAR | Status: DC | PRN
  Administered 2019-07-25 (×4): 0.5 mg via INTRAVENOUS

## 2019-07-25 MED ORDER — METHOCARBAMOL 500 MG IVPB - SIMPLE MED
INTRAVENOUS | Status: AC
  Filled 2019-07-25: qty 50

## 2019-07-25 MED ORDER — HYDROMORPHONE HCL 1 MG/ML IJ SOLN: 0.5000 mg | INTRAMUSCULAR | Status: DC | PRN

## 2019-07-25 MED ORDER — DEXAMETHASONE SODIUM PHOSPHATE 10 MG/ML IJ SOLN
INTRAMUSCULAR | Status: DC | PRN
  Administered 2019-07-25: 10 mg via INTRAVENOUS

## 2019-07-25 MED ORDER — ROCURONIUM BROMIDE 10 MG/ML (PF) SYRINGE
PREFILLED_SYRINGE | INTRAVENOUS | Status: DC | PRN
  Administered 2019-07-25: 50 mg via INTRAVENOUS

## 2019-07-25 MED ORDER — HYDROMORPHONE HCL 1 MG/ML IJ SOLN
INTRAMUSCULAR | Status: AC
  Filled 2019-07-25: qty 1

## 2019-07-25 MED ORDER — MIDAZOLAM HCL 2 MG/2ML IJ SOLN
INTRAMUSCULAR | Status: AC
  Filled 2019-07-25: qty 2

## 2019-07-25 MED ORDER — SUCCINYLCHOLINE CHLORIDE 200 MG/10ML IV SOSY
PREFILLED_SYRINGE | INTRAVENOUS | Status: AC
  Filled 2019-07-25: qty 10

## 2019-07-25 MED ORDER — ASPIRIN EC 81 MG PO TBEC: 81.0000 mg | DELAYED_RELEASE_TABLET | Freq: Two times a day (BID) | ORAL | 0 refills | Status: DC

## 2019-07-25 MED ORDER — SCOPOLAMINE 1 MG/3DAYS TD PT72
MEDICATED_PATCH | TRANSDERMAL | Status: DC | PRN
  Administered 2019-07-25: 1 via TRANSDERMAL

## 2019-07-25 MED ORDER — CLONIDINE HCL (ANALGESIA) 100 MCG/ML EP SOLN
EPIDURAL | Status: DC | PRN
  Administered 2019-07-25: 50 ug

## 2019-07-25 MED ORDER — LORATADINE 10 MG PO TABS
10.0000 mg | ORAL_TABLET | Freq: Every day | ORAL | Status: DC
  Administered 2019-07-25: 21:00:00 10 mg via ORAL
  Filled 2019-07-25: qty 1

## 2019-07-25 MED ORDER — POLYETHYLENE GLYCOL 3350 17 G PO PACK: 17.0000 g | PACK | Freq: Every day | ORAL | Status: DC | PRN

## 2019-07-25 MED ORDER — ROCURONIUM BROMIDE 10 MG/ML (PF) SYRINGE
PREFILLED_SYRINGE | INTRAVENOUS | Status: AC
  Filled 2019-07-25: qty 10

## 2019-07-25 MED ORDER — FLEET ENEMA 7-19 GM/118ML RE ENEM: 1.0000 | ENEMA | Freq: Once | RECTAL | Status: DC | PRN

## 2019-07-25 MED ORDER — ATORVASTATIN CALCIUM 10 MG PO TABS
10.0000 mg | ORAL_TABLET | Freq: Every morning | ORAL | Status: DC
  Administered 2019-07-26: 10 mg via ORAL
  Filled 2019-07-25: qty 1

## 2019-07-25 MED ORDER — TRANEXAMIC ACID-NACL 1000-0.7 MG/100ML-% IV SOLN
1000.0000 mg | Freq: Once | INTRAVENOUS | Status: AC
  Administered 2019-07-25: 1000 mg via INTRAVENOUS
  Filled 2019-07-25: qty 100

## 2019-07-25 MED ORDER — METHOCARBAMOL 500 MG IVPB - SIMPLE MED
500.0000 mg | Freq: Four times a day (QID) | INTRAVENOUS | Status: DC | PRN
  Administered 2019-07-25: 500 mg via INTRAVENOUS
  Filled 2019-07-25: qty 50

## 2019-07-25 MED ORDER — METHOCARBAMOL 500 MG PO TABS
500.0000 mg | ORAL_TABLET | Freq: Four times a day (QID) | ORAL | Status: DC | PRN
  Administered 2019-07-25 – 2019-07-26 (×3): 500 mg via ORAL
  Filled 2019-07-25 (×3): qty 1

## 2019-07-25 MED ORDER — MEPERIDINE HCL 50 MG/ML IJ SOLN: 6.2500 mg | INTRAMUSCULAR | Status: DC | PRN

## 2019-07-25 MED ORDER — SODIUM CHLORIDE 0.9 % IR SOLN
Status: DC | PRN
  Administered 2019-07-25: 1000 mL

## 2019-07-25 MED ORDER — OXYCODONE HCL 5 MG PO TABS
5.0000 mg | ORAL_TABLET | ORAL | Status: DC | PRN
  Administered 2019-07-25 – 2019-07-26 (×5): 10 mg via ORAL
  Filled 2019-07-25 (×5): qty 2

## 2019-07-25 MED ORDER — ONDANSETRON HCL 4 MG PO TABS: 4.0000 mg | ORAL_TABLET | Freq: Four times a day (QID) | ORAL | Status: DC | PRN

## 2019-07-25 MED ORDER — LACTATED RINGERS IV SOLN
INTRAVENOUS | Status: DC
  Administered 2019-07-25 (×2): via INTRAVENOUS

## 2019-07-25 MED ORDER — POVIDONE-IODINE 10 % EX SWAB
2.0000 "application " | Freq: Once | CUTANEOUS | Status: AC
  Administered 2019-07-25: 2 via TOPICAL

## 2019-07-25 MED ORDER — METOCLOPRAMIDE HCL 5 MG PO TABS: 5.0000 mg | ORAL_TABLET | Freq: Three times a day (TID) | ORAL | Status: DC | PRN

## 2019-07-25 MED ORDER — FENTANYL CITRATE (PF) 250 MCG/5ML IJ SOLN
INTRAMUSCULAR | Status: AC
  Filled 2019-07-25: qty 5

## 2019-07-25 MED ORDER — TRANEXAMIC ACID-NACL 1000-0.7 MG/100ML-% IV SOLN
INTRAVENOUS | Status: AC
  Filled 2019-07-25: qty 100

## 2019-07-25 SURGICAL SUPPLY — 53 items
ATTUNE MED DOME PAT 41 KNEE (Knees) ×1 IMPLANT
ATTUNE PS FEM LT SZ 6 CEM KNEE (Femur) ×1 IMPLANT
ATTUNE PSRP INSR SZ6 5 KNEE (Insert) ×1 IMPLANT
BAG DECANTER FOR FLEXI CONT (MISCELLANEOUS) ×2 IMPLANT
BAG ZIPLOCK 12X15 (MISCELLANEOUS) ×2 IMPLANT
BASE TIBIAL ROT PLAT SZ 7 KNEE (Knees) IMPLANT
BLADE SAG 18X100X1.27 (BLADE) ×2 IMPLANT
BLADE SAW SGTL 11.0X1.19X90.0M (BLADE) ×2 IMPLANT
BLADE SURG SZ10 CARB STEEL (BLADE) ×4 IMPLANT
BNDG ELASTIC 6X10 VLCR STRL LF (GAUZE/BANDAGES/DRESSINGS) ×2 IMPLANT
BOWL SMART MIX CTS (DISPOSABLE) ×2 IMPLANT
CEMENT HV SMART SET (Cement) ×4 IMPLANT
COVER SURGICAL LIGHT HANDLE (MISCELLANEOUS) ×2 IMPLANT
COVER WAND RF STERILE (DRAPES) IMPLANT
CUFF TOURN SGL QUICK 34 (TOURNIQUET CUFF) ×2
CUFF TRNQT CYL 34X4.125X (TOURNIQUET CUFF) ×1 IMPLANT
DECANTER SPIKE VIAL GLASS SM (MISCELLANEOUS) ×6 IMPLANT
DRAPE U-SHAPE 47X51 STRL (DRAPES) ×2 IMPLANT
DRSG AQUACEL AG ADV 3.5X10 (GAUZE/BANDAGES/DRESSINGS) ×2 IMPLANT
DURAPREP 26ML APPLICATOR (WOUND CARE) ×2 IMPLANT
ELECT REM PT RETURN 15FT ADLT (MISCELLANEOUS) ×2 IMPLANT
GLOVE BIO SURGEON STRL SZ7.5 (GLOVE) ×2 IMPLANT
GLOVE BIO SURGEON STRL SZ8.5 (GLOVE) ×2 IMPLANT
GLOVE BIOGEL PI IND STRL 8 (GLOVE) ×1 IMPLANT
GLOVE BIOGEL PI IND STRL 9 (GLOVE) ×1 IMPLANT
GLOVE BIOGEL PI INDICATOR 8 (GLOVE) ×1
GLOVE BIOGEL PI INDICATOR 9 (GLOVE) ×1
GOWN STRL REUS W/TWL XL LVL3 (GOWN DISPOSABLE) ×4 IMPLANT
HANDPIECE INTERPULSE COAX TIP (DISPOSABLE) ×2
HOLDER FOLEY CATH W/STRAP (MISCELLANEOUS) ×1 IMPLANT
HOOD PEEL AWAY FLYTE STAYCOOL (MISCELLANEOUS) ×7 IMPLANT
KIT TURNOVER KIT A (KITS) ×1 IMPLANT
NDL HYPO 21X1.5 SAFETY (NEEDLE) ×2 IMPLANT
NEEDLE HYPO 21X1.5 SAFETY (NEEDLE) ×4 IMPLANT
NS IRRIG 1000ML POUR BTL (IV SOLUTION) ×1 IMPLANT
PACK ICE MAXI GEL EZY WRAP (MISCELLANEOUS) ×1 IMPLANT
PACK TOTAL KNEE CUSTOM (KITS) ×2 IMPLANT
PIN STEINMAN FIXATION KNEE (PIN) ×1 IMPLANT
PIN THREADED HEADED SIGMA (PIN) ×1 IMPLANT
PROTECTOR NERVE ULNAR (MISCELLANEOUS) ×2 IMPLANT
SET HNDPC FAN SPRY TIP SCT (DISPOSABLE) ×1 IMPLANT
SUT VIC AB 1 CTX 36 (SUTURE) ×1
SUT VIC AB 1 CTX36XBRD ANBCTR (SUTURE) ×1 IMPLANT
SUT VIC AB 3-0 CT1 27 (SUTURE) ×1
SUT VIC AB 3-0 CT1 TAPERPNT 27 (SUTURE) ×3 IMPLANT
SYR CONTROL 10ML LL (SYRINGE) ×4 IMPLANT
TIBIAL BASE ROT PLAT SZ 7 KNEE (Knees) ×2 IMPLANT
TRAY FOLEY MTR SLVR 14FR STAT (SET/KITS/TRAYS/PACK) ×1 IMPLANT
TRAY FOLEY MTR SLVR 16FR STAT (SET/KITS/TRAYS/PACK) ×1 IMPLANT
WATER STERILE IRR 1000ML POUR (IV SOLUTION) ×4 IMPLANT
WRAP KNEE MAXI GEL POST OP (GAUZE/BANDAGES/DRESSINGS) ×1 IMPLANT
YANKAUER SUCT BULB TIP 10FT TU (MISCELLANEOUS) ×2 IMPLANT
YANKAUER SUCT BULB TIP NO VENT (SUCTIONS) ×1 IMPLANT

## 2019-07-25 NOTE — Evaluation (Signed)
Physical Therapy Evaluation Patient Details Name: Emily Reeves MRN: [id number] DOB: 15-May-1956 Today's Date: 07/25/2019   History of Present Illness  s/p L TKA  Clinical Impression  Pt is s/p TKA resulting in the deficits listed below (see PT Problem List).  Pt stood and took Lateral steps along EOB,limited d/t dizziness and L knee buckling with WBing (likely d/t adductor block), returned pt to supine for safety. Will see in am, expect steady progress.   Pt will benefit from skilled PT to increase their independence and safety with mobility to allow discharge to the venue listed below.      Follow Up Recommendations Follow surgeon's recommendation for DC plan and follow-up therapies    Equipment Recommendations  None recommended by PT    Recommendations for Other Services       Precautions / Restrictions Precautions Precautions: Fall;Knee Restrictions Weight Bearing Restrictions: No Other Position/Activity Restrictions: WBAT      Mobility  Bed Mobility Overal bed mobility: Needs Assistance Bed Mobility: Supine to Sit;Sit to Supine     Supine to sit: Min guard Sit to supine: Min assist   General bed mobility comments: assist for LLE  Transfers Overall transfer level: Needs assistance Equipment used: Rolling walker (2 wheeled) Transfers: Sit to/from Stand Sit to Stand: Min assist;From elevated surface         General transfer comment: assist for anterior wt shift and transition to RW, bed elvated d/t R knee pain and crepitus (returned to supine d/t dizziness and L knee buckling in standing)  Ambulation/Gait             General Gait Details: lateral steps along EOB with min/mod assist, heavy reliance on UEs d/t L knee buckling with WBing  Stairs            Wheelchair Mobility    Modified Rankin (Stroke Patients Only)       Balance                                             Pertinent Vitals/Pain Pain Assessment:  0-10 Pain Score: 3  Pain Location: L knee Pain Descriptors / Indicators: Aching;Grimacing Pain Intervention(s): Limited activity within patient's tolerance;Monitored during session;Premedicated before session;Repositioned;Ice applied    Home Living Family/patient expects to be discharged to:: Private residence Living Arrangements: Spouse/significant other   Type of Home: House Home Access: Stairs to enter Entrance Stairs-Rails: Right;Left;Can reach both Entrance Stairs-Number of Steps: 3-4 Home Layout: One level;Laundry or work area in [place]: Environmental consultant - 2 wheels;Cane - single point;Bedside commode;Toilet riser      Prior Function Level of Independence: Independent               Hand Dominance        Extremity/Trunk Assessment   Upper Extremity Assessment Upper Extremity Assessment: Overall WFL for tasks assessed    Lower Extremity Assessment Lower Extremity Assessment: LLE deficits/detail LLE Deficits / Details: ankle WFL; knee extension 2+/5, hip 2+/5. sensation intact       Communication   Communication: No difficulties  Cognition Arousal/Alertness: Awake/alert Behavior During Therapy: WFL for tasks assessed/performed Overall Cognitive Status: Within Functional Limits for tasks assessed  General Comments      Exercises Total Joint Exercises Ankle Circles/Pumps: AROM;Both;10 reps Quad Sets: 5 reps;Both;AROM Heel Slides: AROM;Left;5 reps   Assessment/Plan    PT Assessment Patient needs continued PT services  PT Problem List Decreased strength;Decreased range of motion;Decreased activity tolerance;Decreased knowledge of use of DME;Pain;Decreased mobility       PT Treatment Interventions DME instruction;Gait training;Functional mobility training;Therapeutic activities;Therapeutic exercise;Stair training;Patient/family education    PT Goals (Current goals can be found in the Care  Plan section)  Acute Rehab PT Goals PT Goal Formulation: With patient Time For Goal Achievement: 08/01/19 Potential to Achieve Goals: Good    Frequency 7X/week   Barriers to discharge        Co-evaluation               AM-PAC PT "6 Clicks" Mobility  Outcome Measure Help needed turning from your back to your side while in a flat bed without using bedrails?: A Little Help needed moving from lying on your back to sitting on the side of a flat bed without using bedrails?: A Little Help needed moving to and from a bed to a chair (including a wheelchair)?: A Lot Help needed standing up from a chair using your arms (e.g., wheelchair or bedside chair)?: A Lot Help needed to walk in hospital room?: A Lot Help needed climbing 3-5 steps with a railing? : Total 6 Click Score: 13    End of Session Equipment Utilized During Treatment: Gait belt Activity Tolerance: Patient tolerated treatment well Patient left: in bed;with call bell/phone within reach;with bed alarm set Nurse Communication: Mobility status PT Visit Diagnosis: Difficulty in walking, not elsewhere classified (R26.2)    Time: [id number] PT Time Calculation (min) (ACUTE ONLY): 23 min   Charges:   PT Evaluation $PT Eval Low Complexity: 1 Low PT Treatments $Therapeutic Activity: 8-22 mins        Kenyon Ana, PT  Pager: [phone] Acute Rehab Dept [facility]): [phone]   07/25/2019   [facility] 07/25/2019, 7:28 PM

## 2019-07-25 NOTE — Op Note (Signed)
PATIENT ID:      Emily Reeves  MRN:     [id number] DOB/AGE:    08-Aug-1956 / 63 y.o.       OPERATIVE REPORT   DATE OF PROCEDURE:  07/25/2019      PREOPERATIVE DIAGNOSIS:   LEFT KNEE OSTEOARTHRITIS      Estimated body mass index is 28.27 kg/m as calculated from the following:   Height as of this encounter: 5\' 10"  (1.778 m).   Weight as of this encounter: 89.4 kg.                                                       POSTOPERATIVE DIAGNOSIS:   LEFT KNEE OSTEOARTHRITIS                                                                       PROCEDURE:  Procedure(s): LEFT TOTAL KNEE ARTHROPLASTY Using DepuyAttune RP implants #6L Femur, #7Tibia, 5 mm Attune RP bearing, 41 Patella    SURGEON: Kerin Salen  ASSISTANT:   Kerry Hough. [employer]   (Present and scrubbed throughout the case, critical for assistance with exposure, retraction, instrumentation, and closure.)        ANESTHESIA: Spinal, 20cc Exparel, 50cc 0.25% Marcaine EBL: 350 cc FLUID REPLACEMENT: 1700 cc crystaloid TOURNIQUET: DRAINS: None TRANEXAMIC ACID: 1gm IV, 2gm topical COMPLICATIONS:  None         INDICATIONS FOR PROCEDURE: The patient has  LEFT KNEE OSTEOARTHRITIS, Val deformities, XR shows bone on bone arthritis, lateral subluxation of tibia. Patient has failed all conservative measures including anti-inflammatory medicines, narcotics, attempts at exercise and weight loss, cortisone injections and viscosupplementation.  Risks and benefits of surgery have been discussed, questions answered.   DESCRIPTION OF PROCEDURE: The patient identified by armband, received  IV antibiotics, in the holding area at [facility]. Patient taken to the operating room, appropriate anesthetic monitors were attached, and Spinal anesthesia was  induced. IV Tranexamic acid was given.Tourniquet applied high to the operative thigh. Lateral post and foot positioner applied to the table, the lower extremity was then prepped and draped in usual  sterile fashion from the toes to the tourniquet. Time-out procedure was performed. The skin and subcutaneous tissue along the incision was injected with 20 cc of a mixture of Exparel and Marcaine solution, using a 20-gauge by 1-1/2 inch needle. We began the operation, with the knee flexed 130 degrees, by making the anterior midline incision starting at handbreadth above the patella going over the patella 1 cm medial to and 4 cm distal to the tibial tubercle. Small bleeders in the skin and the subcutaneous tissue identified and cauterized. Transverse retinaculum was incised and reflected medially and a medial parapatellar arthrotomy was accomplished. the patella was everted and theprepatellar fat pad resected. The superficial medial collateral ligament was then elevated from anterior to posterior along the proximal flare of the tibia and anterior half of the menisci resected. The knee was hyperflexed exposing bone on bone arthritis. Peripheral and notch osteophytes as well as the cruciate ligaments were then resected. We  continued to work our way around posteriorly along the proximal tibia, and externally rotated the tibia subluxing it out from underneath the femur. A McHale PCL retractor was placed through the notch and a lateral Hohmann retractor placed, and we then entered the proximal tibia in line with the Depuy starter drill in line with the axis of the tibia followed by an intramedullary guide rod and 0-degree posterior slope cutting guide. The tibial cutting guide, 4 degree posterior sloped, was pinned into place allowing resection of 7 mm of bone medially and 0 mm of bone laterally. Satisfied with the tibial resection, we then entered the distal femur 2 mm anterior to the PCL origin with the intramedullary guide rod and applied the distal femoral cutting guide set at 9 mm, with 5 degrees of valgus. This was pinned along the epicondylar axis. At this point, the distal femoral cut was accomplished without  difficulty. We then sized for a #6L femoral component and pinned the guide in 0 degrees of external rotation. The chamfer cutting guide was pinned into place. The anterior, posterior, and chamfer cuts were accomplished without difficulty followed by the Attune RP box cutting guide and the box cut. We also removed posterior osteophytes from the posterior femoral condyles. The posterior capsule was injected with Exparel solution. The knee was brought into full extension. We checked our extension gap and fit a 5 mm bearing. Distracting in extension with a lamina spreader,  bleeders in the posterior capsule, Posterior medial and posterior lateral gutter were cauterized.  The transexamic acid-soaked sponge was then placed in the gap of the knee in extension. The knee was flexed 30. The posterior patella cut was accomplished with the 9.5 mm Attune cutting guide, sized for a 96mm dome, and the fixation pegs drilled.The knee was then once again hyperflexed exposing the proximal tibia. We sized for a # 7 tibial base plate, applied the smokestack and the conical reamer followed by the the Delta fin keel punch. We then hammered into place the Attune RP trial femoral component, drilled the lugs, inserted a  5 mm trial bearing, trial patellar button, and took the knee through range of motion from 0-130 degrees. Medial and lateral ligamentous stability was checked. No thumb pressure was required for patellar Tracking. The tourniquet was used for 11 min at 288mm hg. All trial components were removed, mating surfaces irrigated with pulse lavage, and dried with suction and sponges. 10 cc of the Exparel solution was applied to the cancellus bone of the patella distal femur and proximal tibia.  After waiting 30 seconds, the bony surfaces were again, dried with sponges. A double batch of DePuy HV cement was mixed and applied to all bony metallic mating surfaces except for the posterior condyles of the femur itself. In order, we  hammered into place the tibial tray and removed excess cement, the femoral component and removed excess cement. The final Attune RP bearing was inserted, and the knee brought to full extension with compression. The patellar button was clamped into place, and excess cement removed. The knee was held at 30 flexion with compression, while the cement cured. The wound was irrigated out with normal saline solution pulse lavage. The rest of the Exparel was injected into the parapatellar arthrotomy, subcutaneous tissues, and periosteal tissues. The parapatellar arthrotomy was closed with running #1 Vicryl suture. The subcutaneous tissue with 0 and 2-0 undyed Vicryl suture, and the skin with running 3-0 SQ vicryl. An Aquacil and Ace wrap were applied. The patient  was taken to recovery room without difficulty.   Kerin Salen 07/25/2019, 2:16 PM

## 2019-07-25 NOTE — Progress Notes (Signed)
Assisted Dr. Lissa Hoard with left, ultrasound guided, adductor canal block. Side rails up, monitors on throughout procedure. See vital signs in flow sheet. Tolerated Procedure well.

## 2019-07-25 NOTE — Anesthesia Procedure Notes (Signed)
Procedure Name: Intubation Date/Time: 07/25/2019 12:58 PM Performed by: Glory Buff, CRNA Pre-anesthesia Checklist: Patient identified, Emergency Drugs available, Suction available and Patient being monitored Patient Re-evaluated:Patient Re-evaluated prior to induction Oxygen Delivery Method: Circle system utilized Preoxygenation: Pre-oxygenation with 100% oxygen Induction Type: IV induction Ventilation: Mask ventilation without difficulty Laryngoscope Size: Miller and 3 Grade View: Grade I Tube type: Oral Tube size: 7.0 mm Number of attempts: 1 Airway Equipment and Method: Stylet and Oral airway Placement Confirmation: ETT inserted through vocal cords under direct vision,  positive ETCO2 and breath sounds checked- equal and bilateral Secured at: 20 cm Tube secured with: Tape Dental Injury: Teeth and Oropharynx as per pre-operative assessment

## 2019-07-25 NOTE — Anesthesia Procedure Notes (Signed)
Anesthesia Regional Block: Adductor canal block   Pre-Anesthetic Checklist: ,, timeout performed, Correct Patient, Correct Site, Correct Laterality, Correct Procedure, Correct Position, site marked, Risks and benefits discussed,  Surgical consent,  Pre-op evaluation,  At surgeon's request and post-op pain management  Laterality: Left  Prep: chloraprep       Needles:  Injection technique: Single-shot  Needle Type: Stimiplex     Needle Length: 9cm  Needle Gauge: 21     Additional Needles:   Procedures:,,,, ultrasound used (permanent image in chart),,,,  Narrative:  Start time: 07/25/2019 11:45 AM End time: 07/25/2019 11:50 AM Injection made incrementally with aspirations every 5 mL.  Performed by: Personally  Anesthesiologist: Nolon Nations, MD  Additional Notes: BP cuff, EKG monitors applied. Sedation begun. Artery and nerve location verified with U/S and anesthetic injected incrementally, slowly, and after negative aspirations under direct u/s guidance. Good fascial /perineural spread. Tolerated well.

## 2019-07-25 NOTE — Discharge Instructions (Signed)

## 2019-07-25 NOTE — Anesthesia Postprocedure Evaluation (Signed)
Anesthesia Post Note  Patient: Emily Reeves  Procedure(s) Performed: LEFT TOTAL KNEE ARTHROPLASTY (Left Knee)     Patient location during evaluation: PACU Anesthesia Type: General Level of consciousness: sedated and patient cooperative Pain management: pain level controlled Vital Signs Assessment: post-procedure vital signs reviewed and stable Respiratory status: spontaneous breathing Cardiovascular status: stable Anesthetic complications: no    Last Vitals:  Vitals:   07/25/19 1515 07/25/19 1530  BP: (!) 151/74 (!) 148/78  Pulse: 95 88  Resp: (!) 27 11  Temp:    SpO2: 100% 100%    Last Pain:  Vitals:   07/25/19 1530  TempSrc:   PainSc: [name]

## 2019-07-25 NOTE — Transfer of Care (Signed)
Immediate Anesthesia Transfer of Care Note  Patient: Emily Reeves  Procedure(s) Performed: LEFT TOTAL KNEE ARTHROPLASTY (Left Knee)  Patient Location: PACU  Anesthesia Type:General  Level of Consciousness: awake, alert  and oriented  Airway & Oxygen Therapy: Patient Spontanous Breathing and Patient connected to nasal cannula oxygen  Post-op Assessment: Report given to RN and Post -op Vital signs reviewed and stable  Post vital signs: Reviewed and stable  Last Vitals:  Vitals Value Taken Time  BP 147/76 07/25/19 1452  Temp    Pulse 100 07/25/19 1454  Resp 16 07/25/19 1454  SpO2 100 % 07/25/19 1454  Vitals shown include unvalidated device data.  Last Pain:  Vitals:   07/25/19 1231  TempSrc:   PainSc: 0-No pain         Complications: No apparent anesthesia complications

## 2019-07-25 NOTE — Anesthesia Preprocedure Evaluation (Addendum)
Anesthesia Evaluation  Patient identified by MRN, date of birth, ID band Patient awake    Reviewed: Allergy & Precautions, NPO status , Patient's Chart, lab work & pertinent test results  History of Anesthesia Complications (+) PONV and history of anesthetic complications  Airway Mallampati: II  TM Distance: >3 FB Neck ROM: Full    Dental  (+) Teeth Intact, Dental Advisory Given   Pulmonary neg pulmonary ROS,    breath sounds clear to auscultation       Cardiovascular hypertension, Pt. on medications  Rhythm:Regular Rate:Normal     Neuro/Psych PSYCHIATRIC DISORDERS Anxiety    GI/Hepatic GERD  Medicated,  Endo/Other  diabetes, Type 2, Oral Hypoglycemic Agents  Renal/GU      Musculoskeletal  (+) Arthritis ,   Abdominal   Peds  Hematology   Anesthesia Other Findings   Reproductive/Obstetrics                             Anesthesia Physical  Anesthesia Plan  ASA: II  Anesthesia Plan:    Post-op Pain Management:  Regional for Post-op pain   Induction: Intravenous, Rapid sequence and Cricoid pressure planned  PONV Risk Score and Plan:   Airway Management Planned:   Additional Equipment:   Intra-op Plan:   Post-operative Plan:   Informed Consent: I have reviewed the patients History and Physical, chart, labs and discussed the procedure including the risks, benefits and alternatives for the proposed anesthesia with the patient or authorized representative who has indicated his/her understanding and acceptance.     Dental advisory given  Plan Discussed with: CRNA  Anesthesia Plan Comments:         Anesthesia Quick Evaluation

## 2019-07-25 NOTE — Interval H&P Note (Signed)
History and Physical Interval Note:  07/25/2019 9:50 AM  Emily Reeves  has presented today for surgery, with the diagnosis of LEFT KNEE OSTEOARTHRITIS.  The various methods of treatment have been discussed with the patient and family. After consideration of risks, benefits and other options for treatment, the patient has consented to  Procedure(s): LEFT TOTAL KNEE ARTHROPLASTY (Left) as a surgical intervention.  The patient's history has been reviewed, patient examined, no change in status, stable for surgery.  I have reviewed the patient's chart and labs.  Questions were answered to the patient's satisfaction.     Kerin Salen

## 2019-07-26 ENCOUNTER — Encounter (HOSPITAL_COMMUNITY): Payer: Self-pay | Admitting: Orthopedic Surgery

## 2019-07-26 DIAGNOSIS — M1712 Unilateral primary osteoarthritis, left knee: Secondary | ICD-10-CM | POA: Diagnosis not present

## 2019-07-26 LAB — BASIC METABOLIC PANEL
Anion gap: 9 (ref 5–15)
BUN: 14 mg/dL (ref 8–23)
CO2: 24 mmol/L (ref 22–32)
Calcium: 8.6 mg/dL — ABNORMAL LOW (ref 8.9–10.3)
Chloride: 105 mmol/L (ref 98–111)
Creatinine, Ser: 0.84 mg/dL (ref 0.44–1.00)
GFR calc Af Amer: >60 mL/min (ref >60)
GFR calc non Af Amer: >60 mL/min (ref >60)
Glucose, Bld: 227 mg/dL — ABNORMAL HIGH (ref 70–99)
Potassium: 3.9 mmol/L (ref 3.5–5.1)
Sodium: 138 mmol/L (ref 135–145)

## 2019-07-26 LAB — CBC
HCT: 31.1 % — ABNORMAL LOW (ref 36.0–46.0)
Hemoglobin: 9.6 g/dL — ABNORMAL LOW (ref 12.0–15.0)
MCH: 25.8 pg — ABNORMAL LOW (ref 26.0–34.0)
MCHC: 30.9 g/dL (ref 30.0–36.0)
MCV: 83.6 fL (ref 80.0–100.0)
Platelets: 199 10*3/uL (ref 150–400)
RBC: 3.72 MIL/uL — ABNORMAL LOW (ref 3.87–5.11)
RDW: 13.3 % (ref 11.5–15.5)
WBC: 16.2 10*3/uL — ABNORMAL HIGH (ref 4.0–10.5)
nRBC: 0 % (ref 0.0–0.2)

## 2019-07-26 LAB — GLUCOSE, CAPILLARY
Glucose-Capillary: 130 mg/dL — ABNORMAL HIGH (ref 70–99)
Glucose-Capillary: 126 mg/dL — ABNORMAL HIGH (ref 70–99)

## 2019-07-26 NOTE — Discharge Summary (Signed)
Patient ID: Emily Reeves MRN: [id number] DOB/AGE: December 13, 1955 63 y.o.  Admit date: 07/25/2019 Discharge date: 07/26/2019  Admission Diagnoses:  Principal Problem:   Degenerative arthritis of left knee Active Problems:   Status post total left knee replacement   Discharge Diagnoses:  Same  Past Medical History:  Diagnosis Date  . Anxiety   . Arthritis   . Complication of anesthesia   . Diabetes mellitus without complication ([place])   . GERD (gastroesophageal reflux disease)   . History of UTI   . Hyperlipidemia   . Hypertension   . Perimenopausal vasomotor symptoms   . PONV (postoperative nausea and vomiting)     Surgeries: Procedure(s): LEFT TOTAL KNEE ARTHROPLASTY on 07/25/2019   Consultants:   Discharged Condition: Improved  Hospital Course: Emily Reeves is an 63 y.o. female who was admitted 07/25/2019 for operative treatment ofDegenerative arthritis of left knee. Patient has severe unremitting pain that affects sleep, daily activities, and work/hobbies. After pre-op clearance the patient was taken to the operating room on 07/25/2019 and underwent  Procedure(s): LEFT TOTAL KNEE ARTHROPLASTY.    Patient was given perioperative antibiotics:  Anti-infectives (From admission, onward)   Start     Dose/Rate Route Frequency Ordered Stop   07/25/19 0954  ceFAZolin (ANCEF) 2-4 GM/100ML-% IVPB    Note to Pharmacy: Lavon Paganini   : cabinet override      07/25/19 0954 07/25/19 1259   07/25/19 0945  ceFAZolin (ANCEF) IVPB 2g/100 mL premix     2 g 200 mL/hr over 30 Minutes Intravenous On call to O.R. 07/25/19 [id number] 07/25/19 1329       Patient was given sequential compression devices, early ambulation, and chemoprophylaxis to prevent DVT.  Patient benefited maximally from hospital stay and there were no complications.    Recent vital signs:  Patient Vitals for the past 24 hrs:  BP Temp Temp src Pulse Resp SpO2 Height Weight  07/26/19 0530 111/62 98.1 F (36.7 C) Oral 62 18  100 % no documentation no documentation  07/26/19 0123 (Abnormal) 114/59 97.8 F (36.6 C) Oral 66 16 100 % no documentation no documentation  07/25/19 2137 119/69 98.3 F (36.8 C) Oral 70 16 100 % no documentation no documentation  07/25/19 1923 140/75 98.2 F (36.8 C) Oral 80 16 100 % no documentation no documentation  07/25/19 1821 (Abnormal) 144/73 97.7 F (36.5 C) Oral 92 16 100 % no documentation no documentation  07/25/19 1750 (Abnormal) 143/69 97.8 F (36.6 C) Oral 86 15 100 % no documentation no documentation  07/25/19 1605 (Abnormal) 152/83 no documentation no documentation 90 15 100 % no documentation no documentation  07/25/19 1545 (Abnormal) 151/76 98.5 F (36.9 C) no documentation 86 (Abnormal) 9 100 % no documentation no documentation  07/25/19 1530 (Abnormal) 148/78 no documentation no documentation 88 11 100 % no documentation no documentation  07/25/19 1515 (Abnormal) 151/74 no documentation no documentation 95 (Abnormal) 27 100 % no documentation no documentation  07/25/19 1500 (Abnormal) 146/80 no documentation no documentation (Abnormal) 102 14 100 % no documentation no documentation  07/25/19 1452 (Abnormal) 147/76 98.3 F (36.8 C) no documentation (Abnormal) 105 11 100 % no documentation no documentation  07/25/19 1231 128/73 no documentation no documentation 93 20 100 % no documentation no documentation  07/25/19 1221 122/68 no documentation no documentation 87 14 100 % no documentation no documentation  07/25/19 1216 123/66 no documentation no documentation 85 12 100 % no documentation no documentation  07/25/19 1211 121/71 no documentation no  documentation 89 16 100 % no documentation no documentation  07/25/19 1206 127/71 no documentation no documentation 87 13 100 % no documentation no documentation  07/25/19 1201 126/68 no documentation no documentation 90 14 100 % no documentation no documentation  07/25/19 1156 125/67 no documentation no documentation 90  (Abnormal) 9 100 % no documentation no documentation  07/25/19 1151 140/74 no documentation no documentation 95 20 100 % no documentation no documentation  07/25/19 1147 (Abnormal) 153/80 no documentation no documentation 98 12 100 % no documentation no documentation  07/25/19 1011 no documentation no documentation no documentation no documentation no documentation no documentation 5\' 10"  (1.778 m) 89.4 kg  07/25/19 0950 131/78 98.2 F (36.8 C) Oral 85 18 100 % no documentation no documentation     Recent laboratory studies:  Recent Labs    07/26/19 0256  WBC 16.2*  HGB 9.6*  HCT 31.1*  PLT 199  NA 138  K 3.9  CL 105  CO2 24  BUN 14  CREATININE 0.84  GLUCOSE 227*  CALCIUM 8.6*     Discharge Medications:   Allergies as of 07/26/2019   No Known Allergies     Medication List    Stop taking these medications   diclofenac 50 MG EC tablet Commonly known as: VOLTAREN     Take these medications   ALPRAZolam 0.5 MG tablet Commonly known as: XANAX TAKE (1) TABLET BY MOUTH ONCE DAILY AS NEEDED FOR ANXIETY What changed:   how much to take  how to take this  when to take this  reasons to take this  additional instructions   aspirin EC 81 MG tablet Take 1 tablet (81 mg total) by mouth 2 (two) times daily.   atorvastatin 10 MG tablet Commonly known as: LIPITOR Take 10 mg by mouth every morning.   cholecalciferol 25 MCG (1000 UT) tablet Commonly known as: VITAMIN D3 Take 1,000 Units by mouth daily.   diclofenac sodium 1 % Gel Commonly known as: VOLTAREN Apply 2 g topically 4 (four) times daily as needed (joint pain).   esomeprazole 40 MG capsule Commonly known as: NEXIUM Take 40 mg by mouth daily.   hydrochlorothiazide 25 MG tablet Commonly known as: HYDRODIURIL Take 25 mg by mouth daily.   Klor-Con M20 20 MEQ tablet Generic drug: potassium chloride SA Take 20 mEq by mouth daily.   loratadine 10 MG tablet Commonly known as: CLARITIN Take 10 mg by  mouth at bedtime.   losartan 50 MG tablet Commonly known as: COZAAR Take 50 mg by mouth daily.   metFORMIN 500 MG tablet Commonly known as: GLUCOPHAGE Take 500 mg by mouth 2 (two) times daily with a meal.   nystatin-triamcinolone ointment Commonly known as: MYCOLOG Apply 1 application topically 2 (two) times daily. To affected area.   oxyCODONE-acetaminophen 5-325 MG tablet Commonly known as: PERCOCET/ROXICET Take 1 tablet by mouth every 4 (four) hours as needed for severe pain.   tiZANidine 2 MG tablet Commonly known as: ZANAFLEX Take 1 tablet (2 mg total) by mouth every 6 (six) hours as needed.        Durable Medical Equipment  (From admission, onward)         Start     Ordered   07/25/19 1559  DME Walker rolling  Once    Question:  Patient needs a walker to treat with the following condition  Answer:  Status post total left knee replacement   07/25/19 1558   07/25/19 1559  DME 3  n 1  Once     07/25/19 1558           Discharge Care Instructions  (From admission, onward)         Start     Ordered   07/26/19 0000  Change dressing    Comments: Change dressing Only if drainage exceeds 40% of window on dressing   07/26/19 0753          Diagnostic Studies: Dg Chest 2 View  Result Date: 07/20/2019 CLINICAL DATA:  Pre-op for knee arthroplasty. No current complaints. History of hypertension and diabetes. EXAM: CHEST - 2 VIEW COMPARISON:  None. FINDINGS: The heart size and mediastinal contours are normal. There is mild biapical scarring. The lungs are otherwise clear. There is no pleural effusion or pneumothorax. No acute osseous findings are evident. There are mild degenerative changes in the spine associated with a mild convex right scoliosis. Cholecystectomy clips are noted. IMPRESSION: No evidence of active cardiopulmonary process. Electronically Signed   By: Richardean Sale M.D.   On: 07/20/2019 17:03    Disposition: Discharge disposition: 01-Home or Self  Care       Discharge Instructions    Call MD / Call 911   Complete by: As directed    If you experience chest pain or shortness of breath, CALL 911 and be transported to the hospital emergency room.  If you develope a fever above 101 F, pus (white drainage) or increased drainage or redness at the wound, or calf pain, call your surgeon's office.   Change dressing   Complete by: As directed    Change dressing Only if drainage exceeds 40% of window on dressing   Constipation Prevention   Complete by: As directed    Drink plenty of fluids.  Prune juice may be helpful.  You may use a stool softener, such as Colace (over the counter) 100 mg twice a day.  Use MiraLax (over the counter) for constipation as needed.   Diet - low sodium heart healthy   Complete by: As directed    Increase activity slowly as tolerated   Complete by: As directed       Follow-up Information    Frederik Pear, MD In 2 weeks.   Specialty: Orthopedic Surgery Contact information: [place] [address] [phone]            Signed: Kerin Salen 07/26/2019, 7:54 AM

## 2019-07-26 NOTE — Progress Notes (Signed)
Physical Therapy Treatment Patient Details Name: Emily Reeves MRN: [id number] DOB: 13-Oct-1956 Today's Date: 07/26/2019    History of Present Illness s/p L TKA    PT Comments    Pt ambulated short distance in hallway and performed LE exercises.  Will return to practice steps prior to d/c.  Follow Up Recommendations  Follow surgeon's recommendation for DC plan and follow-up therapies     Equipment Recommendations  None recommended by PT    Recommendations for Other Services       Precautions / Restrictions Precautions Precautions: Fall;Knee Restrictions Weight Bearing Restrictions: No Other Position/Activity Restrictions: WBAT    Mobility  Bed Mobility               General bed mobility comments: pt in recliner on arrival  Transfers Overall transfer level: Needs assistance Equipment used: Rolling walker (2 wheeled) Transfers: Sit to/from Stand Sit to Stand: Min assist         General transfer comment: verbal cues for UE and LE positioning, assist to rise and steady  Ambulation/Gait Ambulation/Gait assistance: Min assist Gait Distance (Feet): 40 Feet Assistive device: Rolling walker (2 wheeled) Gait Pattern/deviations: Step-to pattern;Step-through pattern;Antalgic Gait velocity: decr   General Gait Details: verbal cues for sequence, RW positioning, posture; distance to tolerance   Stairs             Wheelchair Mobility    Modified Rankin (Stroke Patients Only)       Balance                                            Cognition Arousal/Alertness: Awake/alert Behavior During Therapy: WFL for tasks assessed/performed Overall Cognitive Status: Within Functional Limits for tasks assessed                                        Exercises Total Joint Exercises Ankle Circles/Pumps: AROM;Both;10 reps Quad Sets: Both;AROM;10 reps Short Arc QuadSinclair Ship;Left;10 reps Heel Slides: AAROM;Left;10 reps Hip  ABduction/ADduction: AAROM;Left;10 reps Straight Leg Raises: AAROM;Left;10 reps Knee Flexion: AAROM;Seated;Left;10 reps    General Comments        Pertinent Vitals/Pain Pain Assessment: 0-10 Pain Score: 5  Pain Location: L knee posterior Pain Descriptors / Indicators: Aching;Grimacing Pain Intervention(s): Monitored during session;Premedicated before session;Repositioned    Home Living                      Prior Function            PT Goals (current goals can now be found in the care plan section) Progress towards PT goals: Progressing toward goals    Frequency    7X/week      PT Plan Current plan remains appropriate    Co-evaluation              AM-PAC PT "6 Clicks" Mobility   Outcome Measure  Help needed turning from your back to your side while in a flat bed without using bedrails?: A Little Help needed moving from lying on your back to sitting on the side of a flat bed without using bedrails?: A Little Help needed moving to and from a bed to a chair (including a wheelchair)?: A Little Help needed standing up from a chair using your arms (e.g.,  wheelchair or bedside chair)?: A Little Help needed to walk in hospital room?: A Little Help needed climbing 3-5 steps with a railing? : A Lot 6 Click Score: 17    End of Session Equipment Utilized During Treatment: Gait belt Activity Tolerance: Patient tolerated treatment well Patient left: in chair;with call bell/phone within reach Nurse Communication: Mobility status PT Visit Diagnosis: Difficulty in walking, not elsewhere classified (R26.2)     Time: 1027-1040 PT Time Calculation (min) (ACUTE ONLY): 13 min  Charges:  $Therapeutic Exercise: 8-22 mins                    Carmelia Bake, PT, DPT Acute Rehabilitation Services Office: [phone] Pager: [phone]  Trena Platt 07/26/2019, 4:58 PM

## 2019-07-26 NOTE — TOC Transition Note (Signed)
Transition of Care [facility]) - CM/SW Discharge Note   Patient Details  Name: Emily Reeves MRN: [id number] Date of Birth: 04/17/1956  Transition of Care [facility]) CM/SW Contact:  Lia Hopping, [place] Phone Number: 07/26/2019, 11:03 AM   Clinical Narrative:    CSW received a call from the patient workers comp. Nelida Gores [phone]. She verified the patient Home Health has been arranged through [facility] to see patient in 24-48 hours of discharge date.  Patient has DME-RW, 3 in1 and [place]. CSW faxed Home Health Orders and Discharge Summary to: [facility] [phone].   Final next level of care: [place] Barriers to Discharge: No Barriers Identified   Patient Goals and CMS Choice Patient states their goals for this hospitalization and ongoing recovery are:: to get better CMS Medicare.gov Compare Post Acute Care list provided to:: Patient Choice offered to / list presented to : Patient  Discharge Placement                    Patient and family notified of of transfer: 07/26/19  Discharge Plan and Services                DME Arranged: N/A DME Agency: NA       HH Arranged: PT, OT [place] Agency: ([place]) Date [place]: 07/26/19 Time [place]: [id number] Representative spoke with at [place]: Amy [phone] EXT. 1976  Social Determinants of Health (SDOH) Interventions     Readmission Risk Interventions No flowsheet data found.

## 2019-07-26 NOTE — Progress Notes (Signed)
PATIENT ID: Emily Reeves  MRN: [id number]  DOB/AGE:  [birth year] / 63 y.o.  1 Day Post-Op Procedure(s) (LRB): LEFT TOTAL KNEE ARTHROPLASTY (Left)    PROGRESS NOTE Subjective: Patient is alert, oriented, no Nausea, no Vomiting, yes passing gas. Taking PO well. Denies SOB, Chest or Calf Pain. Using Incentive Spirometer, PAS in place. Ambulate 5', Patient reports pain as 2/10 .    Objective: Vital signs in last 24 hours: Vitals:   07/25/19 1923 07/25/19 2137 07/26/19 0123 07/26/19 0530  BP: 140/75 119/69 (Abnormal) 114/59 111/62  Pulse: 80 70 66 62  Resp: 16 16 16 18   Temp: 98.2 F (36.8 C) 98.3 F (36.8 C) 97.8 F (36.6 C) 98.1 F (36.7 C)  TempSrc: Oral Oral Oral Oral  SpO2: 100% 100% 100% 100%  Weight:      Height:          Intake/Output from previous day: I/O last 3 completed shifts: In: [id number] [P.O.:540; I.V.:3504; IV Piggyback:350] Out: 1950 [Urine:1800; Blood:150]   Intake/Output this shift: No intake/output data recorded.   LABORATORY DATA: Recent Labs    07/25/19 1629 07/25/19 2139 07/26/19 0256 07/26/19 0746  WBC  --   --  16.2*  --   HGB  --   --  9.6*  --   HCT  --   --  31.1*  --   PLT  --   --  199  --   NA  --   --  138  --   K  --   --  3.9  --   CL  --   --  105  --   CO2  --   --  24  --   BUN  --   --  14  --   CREATININE  --   --  0.84  --   GLUCOSE  --   --  227*  --   GLUCAP 134* 224*  --  130*  CALCIUM  --   --  8.6*  --     Examination: Neurologically intact ABD soft Neurovascular intact Sensation intact distally Intact pulses distally Dorsiflexion/Plantar flexion intact Incision: dressing C/D/I No cellulitis present Compartment soft}  Assessment:   1 Day Post-Op Procedure(s) (LRB): LEFT TOTAL KNEE ARTHROPLASTY (Left) ADDITIONAL DIAGNOSIS: Expected Acute Blood Loss Anemia, morbid obesity  Patient's anticipated LOS is less than 2 midnights, meeting these requirements: - Younger than 51 - Lives within 1 hour of care - Has a  competent adult at home to recover with post-op recover - NO history of  - Chronic pain requiring opiods  - Diabetes  - Coronary Artery Disease  - Heart failure  - Heart attack  - Stroke  - DVT/VTE  - Cardiac arrhythmia  - Respiratory Failure/COPD  - Renal failure  - Anemia  - Advanced Liver disease       Plan: PT/OT WBAT, AROM and PROM  DVT Prophylaxis:  SCDx72hrs, ASA 81 mg BID x 2 weeks DISCHARGE PLAN: Home, today if passes PT DISCHARGE NEEDS: HHPT, Walker and 3-in-1 comode seat     Kerin Salen 07/26/2019, 7:52 AM Patient ID: Emily Reeves, female   DOB: [birth year], 63 y.o.   MRN: [id number]

## 2019-07-26 NOTE — Progress Notes (Signed)
Physical Therapy Treatment Patient Details Name: Emily Reeves MRN: [id number] DOB: 1956-02-14 Today's Date: 07/26/2019    History of Present Illness s/p L TKA    PT Comments    Pt ambulated in hallway and assisted with safe stair technique.  Pt provided with stair handout.  Pt a little unsteady so recommend using gait belt and having a person assist with mobility initially upon d/c home for safety.  Pt prefers to d/c home today.     Follow Up Recommendations  Follow surgeon's recommendation for DC plan and follow-up therapies     Equipment Recommendations  None recommended by PT    Recommendations for Other Services       Precautions / Restrictions Precautions Precautions: Fall;Knee Restrictions Weight Bearing Restrictions: No Other Position/Activity Restrictions: WBAT    Mobility  Bed Mobility               General bed mobility comments: pt in recliner on arrival  Transfers Overall transfer level: Needs assistance Equipment used: Rolling walker (2 wheeled) Transfers: Sit to/from Stand Sit to Stand: Min guard         General transfer comment: verbal cues for UE and LE positioning, increased time however no physical assist this afternoon  Ambulation/Gait Ambulation/Gait assistance: Min assist Gait Distance (Feet): 80 Feet Assistive device: Rolling walker (2 wheeled) Gait Pattern/deviations: Step-to pattern;Step-through pattern;Antalgic Gait velocity: decr   General Gait Details: verbal cues for sequence, RW positioning, posture; distance to tolerance; required assist for steadying with LOB however pt lifting RW so instructed to use RW for support and weight bearing to assist with steadying   Stairs Stairs: Yes Stairs assistance: Min guard Stair Management: Step to pattern;Forwards;With walker Number of Stairs: 2 General stair comments: verbal cues for sequence and safety, pt performed twice; provided with handout and aware of need for another person  to assist for safety   Wheelchair Mobility    Modified Rankin (Stroke Patients Only)       Balance                                            Cognition Arousal/Alertness: Awake/alert Behavior During Therapy: WFL for tasks assessed/performed Overall Cognitive Status: Within Functional Limits for tasks assessed                                        Exercises     General Comments        Pertinent Vitals/Pain Pain Assessment: 0-10 Pain Score: 5  Pain Location: L knee posterior Pain Descriptors / Indicators: Aching;Grimacing Pain Intervention(s): Repositioned;Monitored during session    Home Living                      Prior Function            PT Goals (current goals can now be found in the care plan section) Progress towards PT goals: Progressing toward goals    Frequency    7X/week      PT Plan Current plan remains appropriate    Co-evaluation              AM-PAC PT "6 Clicks" Mobility   Outcome Measure  Help needed turning from your back to your side while in a flat  bed without using bedrails?: A Little Help needed moving from lying on your back to sitting on the side of a flat bed without using bedrails?: A Little Help needed moving to and from a bed to a chair (including a wheelchair)?: A Little Help needed standing up from a chair using your arms (e.g., wheelchair or bedside chair)?: A Little Help needed to walk in hospital room?: A Little Help needed climbing 3-5 steps with a railing? : A Little 6 Click Score: 18    End of Session Equipment Utilized During Treatment: Gait belt Activity Tolerance: Patient tolerated treatment well Patient left: in chair;with call bell/phone within reach Nurse Communication: Mobility status PT Visit Diagnosis: Difficulty in walking, not elsewhere classified (R26.2)     Time: [id number] PT Time Calculation (min) (ACUTE ONLY): 15 min  Charges:  $Gait  Training: 8-22 mins                    Carmelia Bake, PT, DPT Acute Rehabilitation Services Office: [phone] Pager: [phone]  Trena Platt 07/26/2019, 5:03 PM

## 2019-11-14 ENCOUNTER — Other Ambulatory Visit: Payer: Self-pay | Admitting: Obstetrics and Gynecology

## 2019-11-14 NOTE — Telephone Encounter (Signed)
Alprazolam 0.5 mg tab x 30 refil x 2 sent in.

## 2019-11-23 ENCOUNTER — Other Ambulatory Visit (HOSPITAL_COMMUNITY): Payer: Self-pay | Admitting: Internal Medicine

## 2019-11-23 DIAGNOSIS — Z1231 Encounter for screening mammogram for malignant neoplasm of breast: Secondary | ICD-10-CM

## 2019-11-30 ENCOUNTER — Ambulatory Visit (HOSPITAL_COMMUNITY)
Admission: RE | Admit: 2019-11-30 | Discharge: 2019-11-30 | Disposition: A | Discharge status: Home / Self Care | Payer: BC Managed Care – PPO | Source: Ambulatory Visit | Attending: Internal Medicine | Admitting: Internal Medicine

## 2019-11-30 ENCOUNTER — Other Ambulatory Visit: Payer: Self-pay

## 2019-11-30 DIAGNOSIS — Z1231 Encounter for screening mammogram for malignant neoplasm of breast: Secondary | ICD-10-CM

## 2020-03-06 ENCOUNTER — Telehealth: Payer: Self-pay | Admitting: Obstetrics and Gynecology

## 2020-03-06 NOTE — Telephone Encounter (Signed)

## 2020-03-07 ENCOUNTER — Encounter: Payer: Self-pay | Admitting: Obstetrics and Gynecology

## 2020-03-07 ENCOUNTER — Other Ambulatory Visit (HOSPITAL_COMMUNITY)
Admission: RE | Admit: 2020-03-07 | Discharge: 2020-03-07 | Disposition: A | Discharge status: Home / Self Care | Payer: BC Managed Care – PPO | Source: Ambulatory Visit | Attending: Obstetrics and Gynecology | Admitting: Obstetrics and Gynecology

## 2020-03-07 ENCOUNTER — Ambulatory Visit (INDEPENDENT_AMBULATORY_CARE_PROVIDER_SITE_OTHER): Payer: BC Managed Care – PPO | Admitting: Obstetrics and Gynecology

## 2020-03-07 ENCOUNTER — Other Ambulatory Visit: Payer: Self-pay

## 2020-03-07 VITALS — BP 113/70 | HR 82 | Ht 68.5 in | Wt 197.6 lb

## 2020-03-07 DIAGNOSIS — Z01419 Encounter for gynecological examination (general) (routine) without abnormal findings: Secondary | ICD-10-CM

## 2020-03-07 NOTE — Progress Notes (Signed)
PATIENT ID: Emily Reeves, female     DOB: May 29, 1956, 64 y.o.     MRN: [id number]    [facility] Visit  03/07/20           Patient name: Emily Reeves MRN [id number]  Date of birth: 05-18-1956  CC & HPI:  Emily Reeves is a 64 y.o. female presenting today for routine Pap and physical exam. Last Pap 03/22/2018, normal.  She prefers frequent paps even though she is eligible for q 3-5 yr paps.  ROS:  Review of Systems  Constitutional: Negative for diaphoresis, fever, malaise/fatigue and weight loss.  HENT: Negative for congestion and sore throat.   Eyes: Negative for blurred vision and double vision.  Respiratory: Negative for cough and shortness of breath.   Cardiovascular: Negative for chest pain, palpitations and leg swelling.  Gastrointestinal: Negative for constipation, diarrhea, nausea and vomiting.  Genitourinary: Negative for frequency and urgency.  Musculoskeletal: Negative for back pain, falls and myalgias.  Skin: Negative for rash.  Neurological: Negative for dizziness, weakness and headaches.  Psychiatric/Behavioral: Negative for depression. The patient is not nervous/anxious.     Pertinent History Reviewed:  Reviewed: Significant for hx bladder cystotomy at last cesarean (jvf)  Medical         Past Medical History:  Diagnosis Date  . Anxiety   . Arthritis   . Complication of anesthesia   . Diabetes mellitus without complication ([place])   . GERD (gastroesophageal reflux disease)   . History of UTI   . Hyperlipidemia   . Hypertension   . Perimenopausal vasomotor symptoms   . PONV (postoperative nausea and vomiting)                               Surgical Hx:    Past Surgical History:  Procedure Laterality Date  . APPENDECTOMY    . CESAREAN SECTION    . CHOLECYSTECTOMY    . COLONOSCOPY N/A 06/18/2017   Procedure: COLONOSCOPY;  Surgeon: Rogene Houston, MD;  Location: [facility];  Service: Endoscopy;  Laterality: N/A;  830  . FOOT SURGERY  2011  .  POLYPECTOMY  06/18/2017   Procedure: POLYPECTOMY;  Surgeon: Rogene Houston, MD;  Location: [facility];  Service: Endoscopy;;  colon  . RECTOCELE REPAIR N/A 04/22/2016   Procedure: POSTERIOR REPAIR (RECTOCELE);  Surgeon: Jonnie Kind, MD;  Location: [facility] ORS;  Service: Gynecology;  Laterality: N/A;  . ROTATOR CUFF REPAIR    . TONSILLECTOMY    . TOTAL KNEE ARTHROPLASTY Left 07/25/2019   Procedure: LEFT TOTAL KNEE ARTHROPLASTY;  Surgeon: Frederik Pear, MD;  Location: [facility];  Service: Orthopedics;  Laterality: Left;  . TUBAL LIGATION     Medications: Reviewed & Updated - see associated section                       Current Outpatient Medications:  .  ALPRAZolam (XANAX) 0.5 MG tablet, TAKE (1) TABLET BY MOUTH ONCE DAILY AS NEEDED FOR ANXIETY, Disp: 30 tablet, Rfl: 2 .  aspirin EC 81 MG tablet, Take 1 tablet (81 mg total) by mouth 2 (two) times daily., Disp: 60 tablet, Rfl: 0 .  atorvastatin (LIPITOR) 10 MG tablet, Take 10 mg by mouth every morning. , Disp: , Rfl: 12 .  cholecalciferol (VITAMIN D3) 25 MCG (1000 UT) tablet, Take 1,000 Units by mouth daily., Disp: , Rfl:  .  diclofenac sodium (VOLTAREN) 1 % GEL, Apply 2 g topically 4 (four) times daily as needed (joint pain)., Disp: , Rfl:  .  esomeprazole (NEXIUM) 40 MG capsule, Take 40 mg by mouth daily., Disp: , Rfl:  .  hydrochlorothiazide (HYDRODIURIL) 25 MG tablet, Take 25 mg by mouth daily., Disp: , Rfl:  .  KLOR-CON M20 20 MEQ tablet, Take 20 mEq by mouth daily. , Disp: , Rfl: 12 .  loratadine (CLARITIN) 10 MG tablet, Take 10 mg by mouth at bedtime. , Disp: , Rfl:  .  losartan (COZAAR) 50 MG tablet, Take 50 mg by mouth daily., Disp: , Rfl:  .  metFORMIN (GLUCOPHAGE) 500 MG tablet, Take 500 mg by mouth 2 (two) times daily with a meal., Disp: , Rfl:  .  nystatin-triamcinolone ointment (MYCOLOG), Apply 1 application topically 2 (two) times daily. To affected area. (Patient not taking: Reported on 07/15/2019), Disp: 30 g, Rfl: prn .   oxyCODONE-acetaminophen (PERCOCET/ROXICET) 5-325 MG tablet, Take 1 tablet by mouth every 4 (four) hours as needed for severe pain., Disp: 30 tablet, Rfl: 0 .  tiZANidine (ZANAFLEX) 2 MG tablet, Take 1 tablet (2 mg total) by mouth every 6 (six) hours as needed., Disp: 60 tablet, Rfl: 0   Social History: Reviewed -  reports that she has never smoked. She has never used smokeless tobacco.  Objective Findings:  Vitals: There were no vitals taken for this visit.  PHYSICAL EXAMINATION General appearance - alert, well appearing, and in no distress, oriented to person, place, and time and normal appearing weight Mental status - alert, oriented to person, place, and time, normal mood, behavior, speech, dress, motor activity, and thought processes, affect appropriate to mood Chest - not examined Heart - not examined Abdomen - normal Breasts - breasts appear normal, no suspicious masses, no skin or nipple changes or axillary nodes Skin - normal coloration and turgor, no rashes, no suspicious skin lesions noted  PELVIC External genitalia - normal for age Vulva - normal  Vagina - normal  Cervix - deviates to the left, tiny soft with small 5 mm area of nabothian cyst(s) just below the cervical os Uterus - retroflexed, small Adnexa - nontender uterus palpable thru CDS [place] - n/a3 Rectal - normal rectal, no masses, guaiac negative stool obtained   Assessment & Plan:   A:  1.  Well woman with gyn exam  2. Uterine retroversion retroflexion  P:  1.  paps q yr -2 yr on pt preference     By signing my name below, I, [employer], attest that this documentation has been prepared under the direction and in the presence of Jonnie Kind, MD. Electronically Signed: [place]. 03/07/20. 10:32 AM.  I personally performed the services described in this documentation, which was SCRIBED in my presence. The recorded information has been reviewed and considered accurate. It has  been edited as necessary during review. Jonnie Kind, MD

## 2020-03-09 LAB — CYTOLOGY - PAP
Comment: NEGATIVE
Diagnosis: NEGATIVE
High risk HPV: NEGATIVE

## 2020-03-09 NOTE — Progress Notes (Signed)
Normal pap. Negative for HPV

## 2020-09-13 ENCOUNTER — Other Ambulatory Visit: Payer: Self-pay | Admitting: *Deleted

## 2020-09-13 MED ORDER — ALPRAZOLAM 0.5 MG PO TABS: ORAL_TABLET | ORAL | 2 refills | Status: DC

## 2020-11-06 ENCOUNTER — Other Ambulatory Visit (HOSPITAL_COMMUNITY): Payer: Self-pay | Admitting: Internal Medicine

## 2020-11-06 DIAGNOSIS — Z780 Asymptomatic menopausal state: Secondary | ICD-10-CM

## 2020-11-23 ENCOUNTER — Other Ambulatory Visit (HOSPITAL_COMMUNITY): Payer: BC Managed Care – PPO

## 2020-11-30 ENCOUNTER — Other Ambulatory Visit: Payer: Self-pay

## 2020-11-30 ENCOUNTER — Ambulatory Visit (HOSPITAL_COMMUNITY)
Admission: RE | Admit: 2020-11-30 | Discharge: 2020-11-30 | Disposition: A | Discharge status: Home / Self Care | Payer: Medicare Other | Source: Ambulatory Visit | Attending: Internal Medicine | Admitting: Internal Medicine

## 2020-11-30 DIAGNOSIS — Z780 Asymptomatic menopausal state: Secondary | ICD-10-CM | POA: Diagnosis not present

## 2021-01-25 ENCOUNTER — Other Ambulatory Visit (HOSPITAL_COMMUNITY): Payer: Self-pay | Admitting: Internal Medicine

## 2021-01-25 DIAGNOSIS — Z1231 Encounter for screening mammogram for malignant neoplasm of breast: Secondary | ICD-10-CM

## 2021-01-29 IMAGING — DX DG CHEST 2V
2 series · 2 of 2 positions shown · non-contrast
Comparison: None.

CLINICAL DATA: Pre-op for knee arthroplasty. No current complaints.
History of hypertension and diabetes.

EXAM:
CHEST - 2 VIEW

[chest pa]
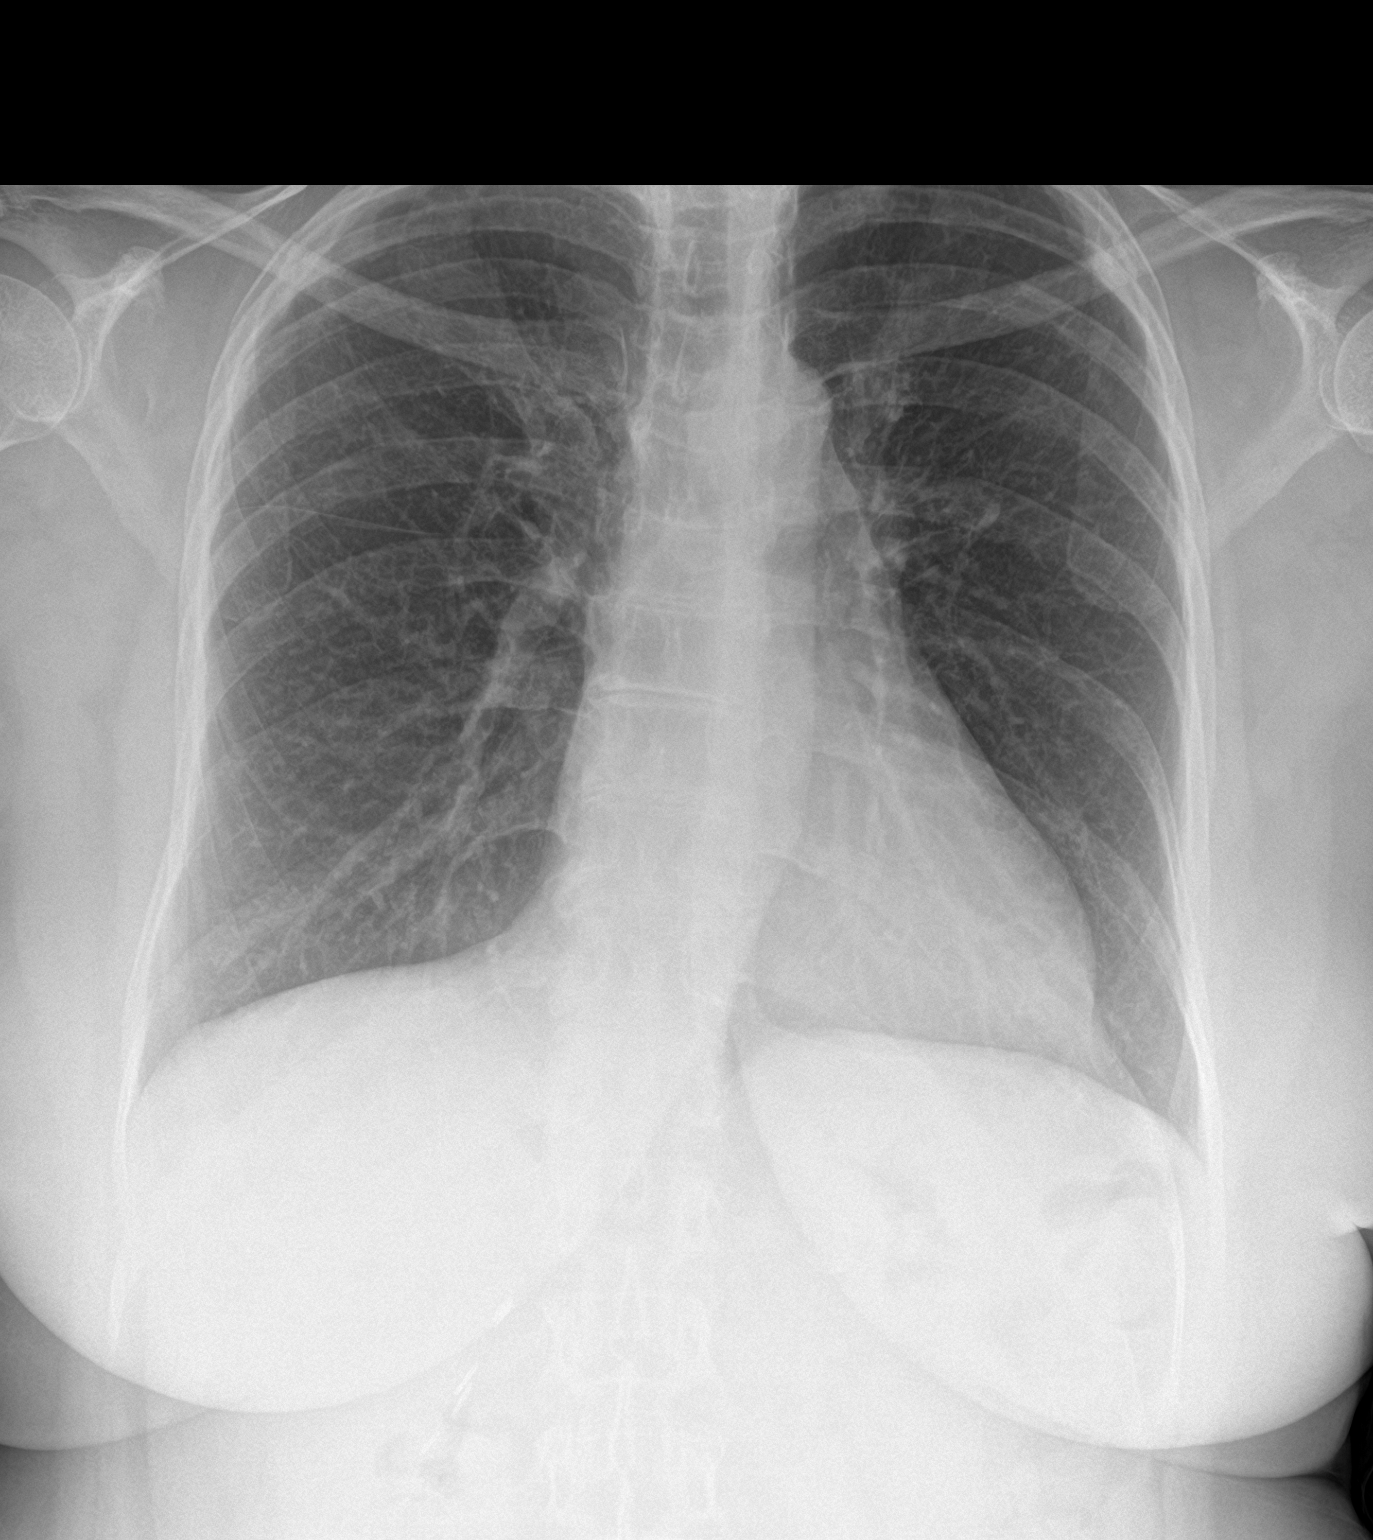

[chest lat]
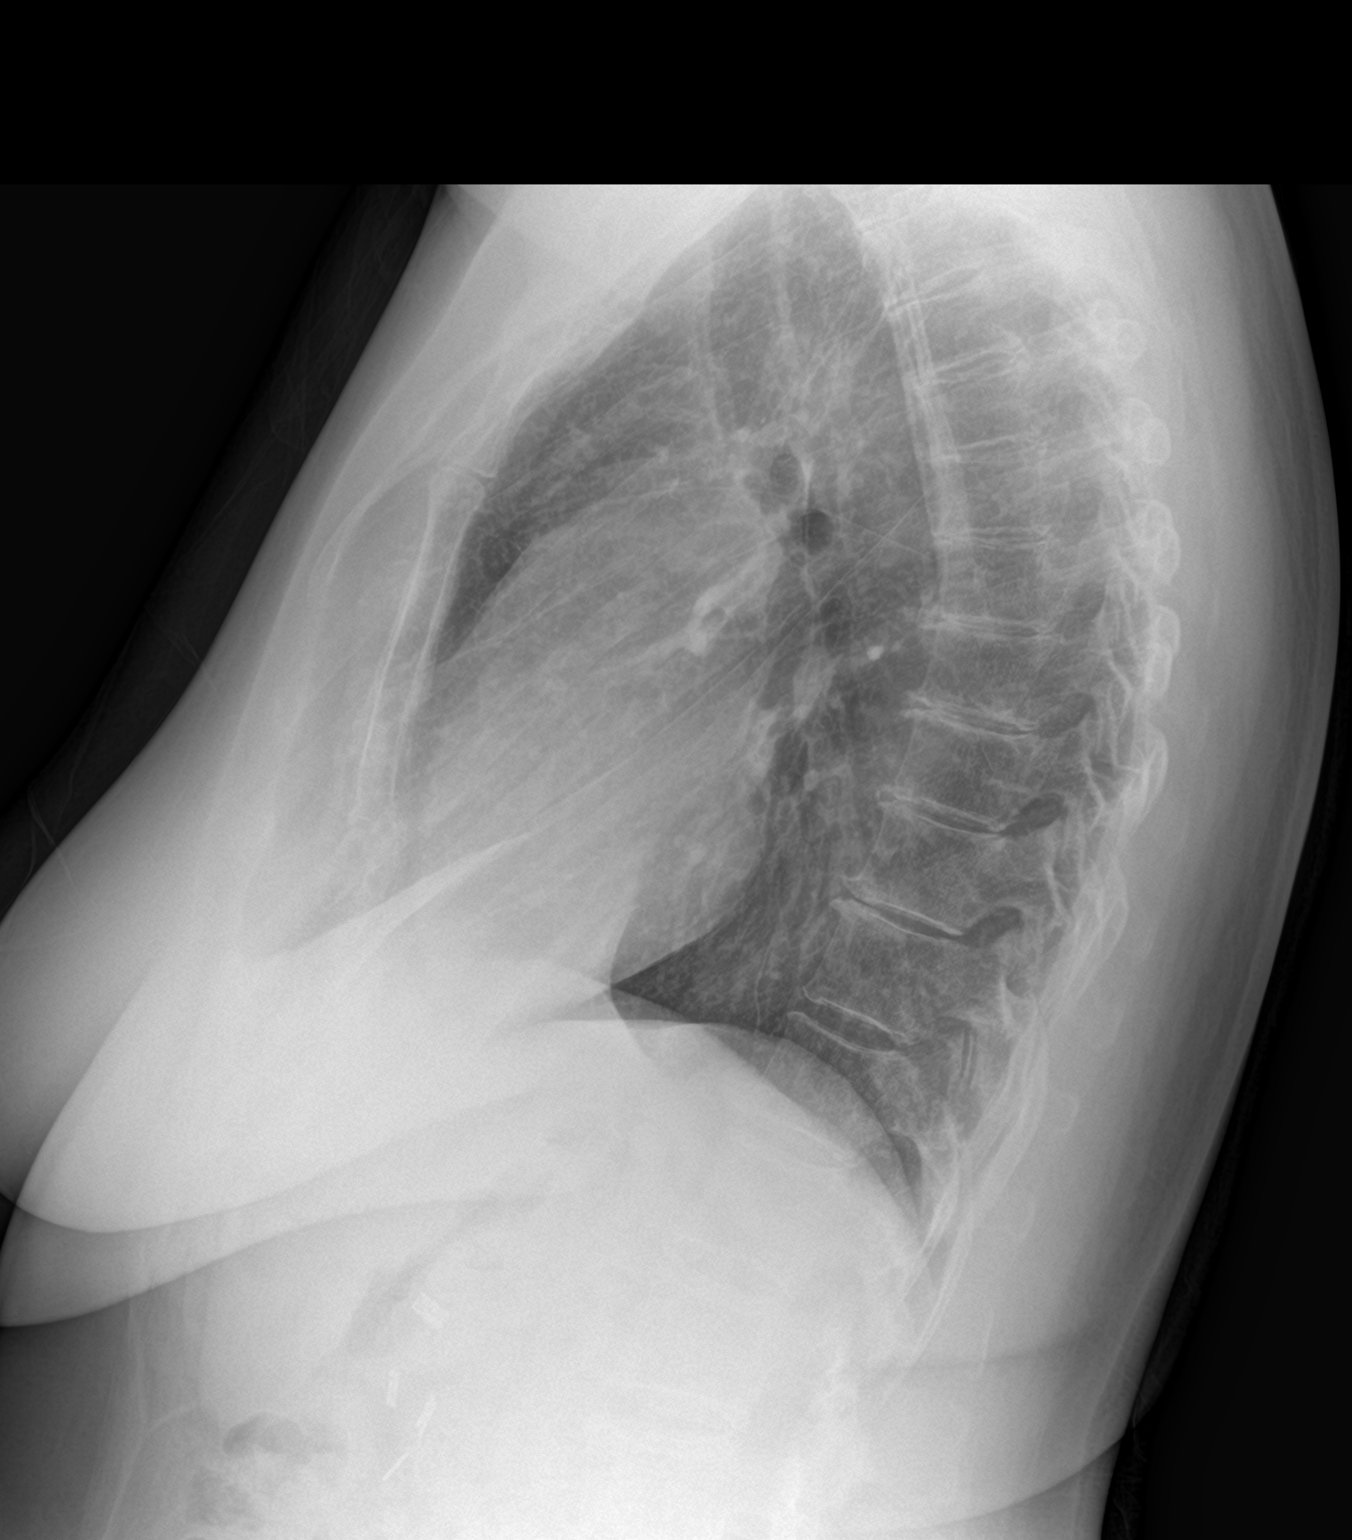

[2 of 2 positions shown; findings below may reference images not displayed]

FINDINGS: The heart size and mediastinal contours are normal. There is mild
biapical scarring. The lungs are otherwise clear. There is no
pleural effusion or pneumothorax. No acute osseous findings are
evident. There are mild degenerative changes in the spine associated
with a mild convex right scoliosis. Cholecystectomy clips are noted.
IMPRESSION: No evidence of active cardiopulmonary process.

## 2021-02-01 ENCOUNTER — Ambulatory Visit (HOSPITAL_COMMUNITY): Payer: Medicare Other

## 2021-02-27 ENCOUNTER — Other Ambulatory Visit: Payer: Self-pay

## 2021-02-27 ENCOUNTER — Ambulatory Visit (HOSPITAL_COMMUNITY)
Admission: RE | Admit: 2021-02-27 | Discharge: 2021-02-27 | Disposition: A | Discharge status: Home / Self Care | Payer: Medicare Other | Source: Ambulatory Visit | Attending: Internal Medicine | Admitting: Internal Medicine

## 2021-02-27 DIAGNOSIS — Z1231 Encounter for screening mammogram for malignant neoplasm of breast: Secondary | ICD-10-CM | POA: Insufficient documentation

## 2021-03-08 ENCOUNTER — Encounter: Payer: Self-pay | Admitting: Obstetrics & Gynecology

## 2021-03-08 ENCOUNTER — Other Ambulatory Visit: Payer: Self-pay

## 2021-03-08 ENCOUNTER — Ambulatory Visit (INDEPENDENT_AMBULATORY_CARE_PROVIDER_SITE_OTHER): Payer: Medicare Other | Admitting: Obstetrics & Gynecology

## 2021-03-08 VITALS — BP 156/77 | HR 92 | Ht 70.0 in | Wt 205.0 lb

## 2021-03-08 DIAGNOSIS — Z01419 Encounter for gynecological examination (general) (routine) without abnormal findings: Secondary | ICD-10-CM | POA: Diagnosis not present

## 2021-03-08 DIAGNOSIS — Z1212 Encounter for screening for malignant neoplasm of rectum: Secondary | ICD-10-CM

## 2021-03-08 DIAGNOSIS — Z1211 Encounter for screening for malignant neoplasm of colon: Secondary | ICD-10-CM

## 2021-03-08 NOTE — Progress Notes (Signed)
Subjective:     Emily Reeves is a 65 y.o. female here for a routine exam.  No LMP recorded. Patient is postmenopausal. [id number] Birth Control Method:  Pot menopausal Menstrual Calendar(currently): amenorrheic  Current complaints: none.   Current acute medical issues:  none   Recent Gynecologic History No LMP recorded. Patient is postmenopausal. Last Pap: 2021,  normal Last mammogram: 2022,  normal  Past Medical History:  Diagnosis Date  . Anxiety   . Arthritis   . Complication of anesthesia   . Diabetes mellitus without complication ([place])   . GERD (gastroesophageal reflux disease)   . History of UTI   . Hyperlipidemia   . Hypertension   . Perimenopausal vasomotor symptoms   . PONV (postoperative nausea and vomiting)     Past Surgical History:  Procedure Laterality Date  . APPENDECTOMY    . CESAREAN SECTION    . CHOLECYSTECTOMY    . COLONOSCOPY N/A 06/18/2017   Procedure: COLONOSCOPY;  Surgeon: Rogene Houston, MD;  [facility];  Service: Endoscopy;  Laterality: N/A;  830  . FOOT SURGERY  2011  . POLYPECTOMY  06/18/2017   Procedure: POLYPECTOMY;  Surgeon: Rogene Houston, MD;  [facility]: [facility];  Service: Endoscopy;;  colon  . RECTOCELE REPAIR N/A 04/22/2016   Procedure: POSTERIOR REPAIR (RECTOCELE);  Surgeon: Jonnie Kind, MD;  Location: [facility];  Service: Gynecology;  Laterality: N/A;  . ROTATOR CUFF REPAIR    . TONSILLECTOMY    . TOTAL KNEE ARTHROPLASTY Left 07/25/2019   Procedure: LEFT TOTAL KNEE ARTHROPLASTY;  Surgeon: Frederik Pear, MD;  Location: [facility];  Service: Orthopedics;  Laterality: Left;  . TUBAL LIGATION      OB History    Gravida  3   Para  3   Term  3   Preterm      AB      Living  3     SAB      IAB      Ectopic      Multiple      Live Births  3           Social History   Socioeconomic History  . Marital status: Married    Spouse name: Not on file  . Number of children: Not on file  . Years of  education: Not on file  . Highest education level: Not on file  Occupational History  . Not on file  Tobacco Use  . Smoking status: Never Smoker  . Smokeless tobacco: Never Used  Vaping Use  . Vaping Use: Never used  Substance and Sexual Activity  . Alcohol use: No  . Drug use: No  . Sexual activity: Yes    Birth control/protection: Post-menopausal  Other Topics Concern  . Not on file  Social History Narrative  . Not on file   Social Determinants of Health   Financial Resource Strain: Low Risk   . Difficulty of Paying Living Expenses: Not hard at all  Food Insecurity: No Food Insecurity  . Worried About Charity fundraiser in the Last Year: Never true  . Ran Out of Food in the Last Year: Never true  Transportation Needs: No Transportation Needs  . Lack of Transportation (Medical): No  . Lack of Transportation (Non-Medical): No  Physical Activity: Insufficiently Active  . Days of Exercise per Week: 1 day  . Minutes of Exercise per Session: 10 min  Stress: No Stress Concern Present  .  Feeling of Stress : Not at all  Social Connections: Socially Integrated  . Frequency of Communication with Friends and Family: More than three times a week  . Frequency of Social Gatherings with Friends and Family: More than three times a week  . Attends Religious Services: More than 4 times per year  . Active Member of Clubs or Organizations: Yes  . Attends Archivist Meetings: More than 4 times per year  . Marital Status: Married    Family History  Problem Relation Age of Onset  . Cancer Mother        breast  . Heart disease Mother        angina  . Hypertension Mother   . Heart disease Father        heart attack  . Heart disease Maternal Grandfather        heart attack  . Diabetes Maternal Grandfather   . Hypertension Maternal Aunt   . Heart disease Maternal Aunt   . Colon cancer Neg Hx      Current Outpatient Medications:  .  ALPRAZolam (XANAX) 0.5 MG tablet, Take  1 tablet by mouth once daily as needed for anxiety., Disp: 30 tablet, Rfl: 2 .  aspirin EC 81 MG tablet, Take 1 tablet (81 mg total) by mouth 2 (two) times daily., Disp: 60 tablet, Rfl: 0 .  atorvastatin (LIPITOR) 10 MG tablet, Take 10 mg by mouth every morning. , Disp: , Rfl: 12 .  celecoxib (CELEBREX) 200 MG capsule, Take 200 mg by mouth 2 (two) times daily., Disp: , Rfl:  .  cholecalciferol (VITAMIN D3) 25 MCG (1000 UT) tablet, Take 1,000 Units by mouth daily., Disp: , Rfl:  .  diclofenac sodium (VOLTAREN) 1 % GEL, Apply 2 g topically 4 (four) times daily as needed (joint pain)., Disp: , Rfl:  .  esomeprazole (NEXIUM) 40 MG capsule, Take 40 mg by mouth daily., Disp: , Rfl:  .  hydrochlorothiazide (HYDRODIURIL) 25 MG tablet, Take 25 mg by mouth daily., Disp: , Rfl:  .  KLOR-CON M20 20 MEQ tablet, Take 20 mEq by mouth daily. , Disp: , Rfl: 12 .  loratadine (CLARITIN) 10 MG tablet, Take 10 mg by mouth at bedtime., Disp: , Rfl:  .  losartan (COZAAR) 50 MG tablet, Take 50 mg by mouth daily., Disp: , Rfl:  .  meclizine (ANTIVERT) 25 MG tablet, Take by mouth., Disp: , Rfl:  .  metFORMIN (GLUCOPHAGE) 500 MG tablet, Take 500 mg by mouth 2 (two) times daily with a meal., Disp: , Rfl:   Review of Systems  Review of Systems  Constitutional: Negative for fever, chills, weight loss, malaise/fatigue and diaphoresis.  HENT: Negative for hearing loss, ear pain, nosebleeds, congestion, sore throat, neck pain, tinnitus and ear discharge.   Eyes: Negative for blurred vision, double vision, photophobia, pain, discharge and redness.  Respiratory: Negative for cough, hemoptysis, sputum production, shortness of breath, wheezing and stridor.   Cardiovascular: Negative for chest pain, palpitations, orthopnea, claudication, leg swelling and PND.  Gastrointestinal: negative for abdominal pain. Negative for heartburn, nausea, vomiting, diarrhea, constipation, blood in stool and melena.  Genitourinary: Negative for  dysuria, urgency, frequency, hematuria and flank pain.  Musculoskeletal: Negative for myalgias, back pain, joint pain and falls.  Skin: Negative for itching and rash.  Neurological: Negative for dizziness, tingling, tremors, sensory change, speech change, focal weakness, seizures, loss of consciousness, weakness and headaches.  Endo/Heme/Allergies: Negative for environmental allergies and polydipsia. Does not bruise/bleed easily.  Psychiatric/Behavioral: Negative for depression, suicidal ideas, hallucinations, memory loss and substance abuse. The patient is not nervous/anxious and does not have insomnia.        Objective:  Blood pressure (!) 156/77, pulse 92, height 5\' 10"  (1.778 m), weight 205 lb (93 kg).   Physical Exam  Vitals reviewed. Constitutional: She is oriented to person, place, and time. She appears well-developed and well-nourished.  HENT:  Head: Normocephalic and atraumatic.        Right Ear: External ear normal.  Left Ear: External ear normal.  Nose: Nose normal.  Mouth/Throat: Oropharynx is clear and moist.  Eyes: Conjunctivae and EOM are normal. Pupils are equal, round, and reactive to light. Right eye exhibits no discharge. Left eye exhibits no discharge. No scleral icterus.  Neck: Normal range of motion. Neck supple. No tracheal deviation present. No thyromegaly present.  Cardiovascular: Normal rate, regular rhythm, normal heart sounds and intact distal pulses.  Exam reveals no gallop and no friction rub.   No murmur heard. Respiratory: Effort normal and breath sounds normal. No respiratory distress. She has no wheezes. She has no rales. She exhibits no tenderness.  GI: Soft. Bowel sounds are normal. She exhibits no distension and no mass. There is no tenderness. There is no rebound and no guarding.  Genitourinary:  Breasts no masses skin changes or nipple changes bilaterally      Vulva is normal without lesions Vagina is pink moist without discharge Cervix normal in  appearance and pap is done Uterus is normal size shape and contour Adnexa is negative with normal sized ovaries  {Rectal    hemoccult negative, normal tone, no masses  Musculoskeletal: Normal range of motion. She exhibits no edema and no tenderness.  Neurological: She is alert and oriented to person, place, and time. She has normal reflexes. She displays normal reflexes. No cranial nerve deficit. She exhibits normal muscle tone. Coordination normal.  Skin: Skin is warm and dry. No rash noted. No erythema. No pallor.  Psychiatric: She has a normal mood and affect. Her behavior is normal. Judgment and thought content normal.       Medications Ordered at today's visit: No orders of the defined types were placed in this encounter.   Other orders placed at today's visit: No orders of the defined types were placed in this encounter.     Assessment:    Normal Gyn exam.    Plan:    Contraception: post menopausal status. Follow up in: 3 weeks. recommended [employer] from [employer] for menopausal symptoms     Return in about 3 years (around 03/08/2024) for yearly.

## 2021-04-25 ENCOUNTER — Other Ambulatory Visit (HOSPITAL_COMMUNITY): Payer: Self-pay | Admitting: Internal Medicine

## 2021-04-25 DIAGNOSIS — M79642 Pain in left hand: Secondary | ICD-10-CM

## 2021-04-25 DIAGNOSIS — M25532 Pain in left wrist: Secondary | ICD-10-CM

## 2021-04-26 ENCOUNTER — Ambulatory Visit (HOSPITAL_COMMUNITY)
Admission: RE | Admit: 2021-04-26 | Discharge: 2021-04-26 | Disposition: A | Discharge status: Home / Self Care | Payer: Medicare Other | Source: Ambulatory Visit | Attending: Internal Medicine | Admitting: Internal Medicine

## 2021-04-26 ENCOUNTER — Other Ambulatory Visit: Payer: Self-pay

## 2021-04-26 DIAGNOSIS — M79642 Pain in left hand: Secondary | ICD-10-CM | POA: Diagnosis not present

## 2021-04-26 DIAGNOSIS — M25532 Pain in left wrist: Secondary | ICD-10-CM | POA: Diagnosis present

## 2021-07-02 ENCOUNTER — Other Ambulatory Visit: Payer: Self-pay | Admitting: Internal Medicine

## 2021-07-02 DIAGNOSIS — R1013 Epigastric pain: Secondary | ICD-10-CM

## 2021-07-05 ENCOUNTER — Other Ambulatory Visit: Payer: Self-pay | Admitting: Obstetrics & Gynecology

## 2021-07-09 ENCOUNTER — Other Ambulatory Visit: Payer: Self-pay

## 2021-07-09 ENCOUNTER — Ambulatory Visit (HOSPITAL_COMMUNITY)
Admission: RE | Admit: 2021-07-09 | Discharge: 2021-07-09 | Disposition: A | Discharge status: Home / Self Care | Payer: Medicare Other | Source: Ambulatory Visit | Attending: Internal Medicine | Admitting: Internal Medicine

## 2021-07-09 DIAGNOSIS — R1013 Epigastric pain: Secondary | ICD-10-CM

## 2022-02-03 ENCOUNTER — Other Ambulatory Visit: Payer: Self-pay | Admitting: Obstetrics & Gynecology

## 2022-02-10 ENCOUNTER — Other Ambulatory Visit (HOSPITAL_COMMUNITY): Payer: Self-pay | Admitting: Internal Medicine

## 2022-02-10 DIAGNOSIS — Z1231 Encounter for screening mammogram for malignant neoplasm of breast: Secondary | ICD-10-CM

## 2022-03-03 ENCOUNTER — Ambulatory Visit (HOSPITAL_COMMUNITY)
Admission: RE | Admit: 2022-03-03 | Discharge: 2022-03-03 | Disposition: A | Discharge status: Home / Self Care | Payer: Medicare Other | Source: Ambulatory Visit | Attending: Internal Medicine | Admitting: Internal Medicine

## 2022-03-03 DIAGNOSIS — Z1231 Encounter for screening mammogram for malignant neoplasm of breast: Secondary | ICD-10-CM | POA: Diagnosis not present

## 2022-05-23 ENCOUNTER — Encounter (INDEPENDENT_AMBULATORY_CARE_PROVIDER_SITE_OTHER): Payer: Self-pay | Admitting: *Deleted

## 2022-07-03 ENCOUNTER — Other Ambulatory Visit (INDEPENDENT_AMBULATORY_CARE_PROVIDER_SITE_OTHER): Payer: Self-pay

## 2022-07-03 DIAGNOSIS — Z86010 Personal history of colonic polyps: Secondary | ICD-10-CM

## 2022-07-03 DIAGNOSIS — I10 Essential (primary) hypertension: Secondary | ICD-10-CM

## 2022-07-13 ENCOUNTER — Emergency Department (HOSPITAL_COMMUNITY)
Admission: EM | Admit: 2022-07-13 | Discharge: 2022-07-13 | Disposition: A | Discharge status: Home / Self Care | Payer: Medicare Other | Attending: Emergency Medicine | Admitting: Emergency Medicine

## 2022-07-13 ENCOUNTER — Encounter (HOSPITAL_COMMUNITY): Payer: Self-pay

## 2022-07-13 ENCOUNTER — Other Ambulatory Visit: Payer: Self-pay

## 2022-07-13 DIAGNOSIS — Z79899 Other long term (current) drug therapy: Secondary | ICD-10-CM | POA: Insufficient documentation

## 2022-07-13 DIAGNOSIS — Z7984 Long term (current) use of oral hypoglycemic drugs: Secondary | ICD-10-CM | POA: Diagnosis not present

## 2022-07-13 DIAGNOSIS — M545 Low back pain, unspecified: Secondary | ICD-10-CM | POA: Diagnosis present

## 2022-07-13 DIAGNOSIS — I10 Essential (primary) hypertension: Secondary | ICD-10-CM | POA: Insufficient documentation

## 2022-07-13 DIAGNOSIS — E119 Type 2 diabetes mellitus without complications: Secondary | ICD-10-CM | POA: Diagnosis not present

## 2022-07-13 DIAGNOSIS — Z7982 Long term (current) use of aspirin: Secondary | ICD-10-CM | POA: Diagnosis not present

## 2022-07-13 DIAGNOSIS — M5442 Lumbago with sciatica, left side: Secondary | ICD-10-CM | POA: Diagnosis not present

## 2022-07-13 MED ORDER — LIDOCAINE 5 % EX PTCH: 1.0000 | MEDICATED_PATCH | CUTANEOUS | 0 refills | Status: AC

## 2022-07-13 MED ORDER — OXYCODONE-ACETAMINOPHEN 5-325 MG PO TABS
1.0000 | ORAL_TABLET | Freq: Once | ORAL | Status: AC
  Administered 2022-07-13: 1 via ORAL
  Filled 2022-07-13: qty 1

## 2022-07-13 MED ORDER — PREDNISONE 20 MG PO TABS: 40.0000 mg | ORAL_TABLET | Freq: Every day | ORAL | 0 refills | Status: DC

## 2022-07-13 NOTE — Discharge Instructions (Signed)
You can use Tylenol up to 1000 mg every 6 hours, max 4000 mg in 24 hours.  You can use the muscle relaxant I am prescribing in addition to the above to help with any breakthrough pain.  You can take it up to twice daily.  It is safe to take at night, but I would be cautious taking it during the day as it can cause some drowsiness.  Make sure that you are feeling awake and alert before you get behind the wheel of a car or operate a motor vehicle.  It is not a narcotic pain medication so you are able to take it if it is not making you drowsy and still pilot a vehicle or machinery safely.  You can use the lidocaine patches in addition to the above.  I have provided some information regarding prednisone and the side effects that we discussed to your discharge instructions.  For the side effects, they should only be present for the time that you are taking the medication and should resolve shortly thereafter.

## 2022-07-13 NOTE — ED Triage Notes (Signed)
Pt presents to ED with complaints of lower back pain in hip and down left leg. Pt states started Thursday when she was lifting luggage.

## 2022-07-13 NOTE — ED Provider Notes (Signed)
[facility] EMERGENCY DEPARTMENT Provider Note   CSN: [id number] Arrival date & time: 07/13/22  1536     History  Chief Complaint  Patient presents with   Back Pain    Emily Reeves is a 66 y.o. female with past medical history significant for diabetes, hypertension, GERD who presents with concern for left back pain, left hip pain since Thursday, worsening over the last 2 days.  Patient reports she is having radiation of pain down her left leg.  Patient reports that she was doing some lifting of luggage after her vacation to [facility].  She did not spend any time on a plane, denied any other traumatic injury of the back.  She denies previous back surgery or injury.  She denies any current difficulty with urination, defecation, or urinary or fecal incontinence, she denies previous history of cancer, IV drug use, chronic corticosteroid use, or recent fever.  She reports that she cannot take NSAIDs secondary to acid reflux, but is taking Tylenol with minimal relief.   Back Pain      Home Medications Prior to Admission medications   Medication Sig Start Date End Date Taking? Authorizing Provider  lidocaine (LIDODERM) 5 % Place 1 patch onto the skin daily. Remove & Discard patch within 12 hours or as directed by MD 07/13/22  Yes Lynsie Mcwatters H, PA-C  predniSONE (DELTASONE) 20 MG tablet Take 2 tablets (40 mg total) by mouth daily. 07/13/22  Yes Cartier Washko H, PA-C  ALPRAZolam (XANAX) 0.5 MG tablet TAKE 1 TABLET BY MOUTH EVERY DAY AS NEEDED FOR ANXIETY 02/04/22   Florian Buff, MD  aspirin EC 81 MG tablet Take 1 tablet (81 mg total) by mouth 2 (two) times daily. 07/25/19   Leighton Parody, PA-C  atorvastatin (LIPITOR) 10 MG tablet Take 10 mg by mouth every morning.  02/29/16   [provider]  celecoxib (CELEBREX) 200 MG capsule Take 200 mg by mouth 2 (two) times daily.    [provider]  cholecalciferol (VITAMIN D3) 25 MCG (1000 UT) tablet Take 1,000 Units  by mouth daily.    [provider]  diclofenac sodium (VOLTAREN) 1 % GEL Apply 2 g topically 4 (four) times daily as needed (joint pain).    [provider]  esomeprazole (NEXIUM) 40 MG capsule Take 40 mg by mouth daily.    [provider]  hydrochlorothiazide (HYDRODIURIL) 25 MG tablet Take 25 mg by mouth daily.    [provider]  KLOR-CON M20 20 MEQ tablet Take 20 mEq by mouth daily.  01/20/18   [provider]  loratadine (CLARITIN) 10 MG tablet Take 10 mg by mouth at bedtime.    [provider]  losartan (COZAAR) 50 MG tablet Take 50 mg by mouth daily.    [provider]  meclizine (ANTIVERT) 25 MG tablet Take by mouth. 02/25/21   [provider]  metFORMIN (GLUCOPHAGE) 500 MG tablet Take 500 mg by mouth 2 (two) times daily with a meal.    [provider]      Allergies    Patient has no known allergies.    Review of Systems   Review of Systems  Musculoskeletal:  Positive for back pain.  All other systems reviewed and are negative.   Physical Exam Updated Vital Signs BP (!) 168/79 (BP Location: Right Arm)   Pulse 99   Temp 98.1 F (36.7 C)   Resp 16   Ht '5\' 10"'$  (1.778 m)  Wt 89.8 kg   SpO2 100%   BMI 28.41 kg/m  Physical Exam Vitals and nursing note reviewed.  Constitutional:      General: She is not in acute distress.    Appearance: Normal appearance.  HENT:     Head: Normocephalic and atraumatic.  Eyes:     General:        Right eye: No discharge.        Left eye: No discharge.  Cardiovascular:     Rate and Rhythm: Normal rate and regular rhythm.     Pulses: Normal pulses.  Pulmonary:     Effort: Pulmonary effort is normal. No respiratory distress.  Musculoskeletal:        General: No deformity.     Comments: Tenderness palpation in the lumbar paraspinous muscles in the left, no significant midline tenderness throughout the cervical, thoracic, lumbar spine.  Intact strength 5 out  of 5 to flexion, extension at the left hip, knee, patient can ambulate without difficulty.  Skin:    General: Skin is warm and dry.     Capillary Refill: Capillary refill takes less than 2 seconds.  Neurological:     Mental Status: She is alert and oriented to person, place, and time.  Psychiatric:        Mood and Affect: Mood normal.        Behavior: Behavior normal.     ED Results / Procedures / Treatments   Labs (all labs ordered are listed, but only abnormal results are displayed) Labs Reviewed - No data to display  EKG None  Radiology No results found.  Procedures Procedures    Medications Ordered in ED Medications  oxyCODONE-acetaminophen (PERCOCET/ROXICET) 5-325 MG per tablet 1 tablet (has no administration in time range)    ED Course/ Medical Decision Making/ A&P                           Medical Decision Making Risk Prescription drug management.   Patient with left lumbar back pain.  My emergent differential diagnosis includes slipped disc, compression fracture, spondylolisthesis, less clinical concern for epidural abscess or osteomyelitis based on patient history.  No neurological deficits. Patient is ambulatory. No warning symptoms of back pain including: fecal incontinence, urinary retention or overflow incontinence, night sweats, waking from sleep with back pain, unexplained fevers or weight loss, h/o cancer, IVDU, recent trauma. No concern for cauda equina, epidural abscess, or other serious cause of back pain.  Symptoms are consistent with low back pain with left-sided sciatica.  Given this work-up, evaluation, physical exam I do not believe that radiographic imaging is indicated at this time.  Conservative measures such as rest, ice/heat, Tylenol, Lidocaine patches and  prescription for Robaxin indicated with orthopedic follow-up if no improvement with conservative management.  Given patient cannot take NSAIDs, think it would be reasonable with her duration of  symptoms and level of pain to try short course of steroid burst.  Extensive return precautions given, patient discharged in stable condition at this time.  Final Clinical Impression(s) / ED Diagnoses Final diagnoses:  Acute left-sided low back pain with left-sided sciatica    Rx / DC Orders ED Discharge Orders          Ordered    predniSONE (DELTASONE) 20 MG tablet  Daily        07/13/22 1646    lidocaine (LIDODERM) 5 %  Every 24 hours  07/13/22 1646              Jonanthony Nahar, [place] H, PA-C 07/13/22 1651    Milton Ferguson, MD 07/14/22 1256

## 2022-07-31 ENCOUNTER — Other Ambulatory Visit: Payer: Self-pay | Admitting: Orthopedic Surgery

## 2022-07-31 DIAGNOSIS — G8929 Other chronic pain: Secondary | ICD-10-CM

## 2022-08-11 ENCOUNTER — Ambulatory Visit
Admission: RE | Admit: 2022-08-11 | Discharge: 2022-08-11 | Disposition: A | Discharge status: Home / Self Care | Payer: Medicare Other | Source: Ambulatory Visit | Attending: Orthopedic Surgery | Admitting: Orthopedic Surgery

## 2022-08-11 DIAGNOSIS — G8929 Other chronic pain: Secondary | ICD-10-CM

## 2022-08-11 MED ORDER — IOPAMIDOL (ISOVUE-M 200) INJECTION 41%
1.0000 mL | Freq: Once | INTRAMUSCULAR | Status: AC
  Administered 2022-08-11: 1 mL via EPIDURAL

## 2022-08-11 MED ORDER — METHYLPREDNISOLONE ACETATE 40 MG/ML INJ SUSP (RADIOLOG
80.0000 mg | Freq: Once | INTRAMUSCULAR | Status: AC
  Administered 2022-08-11: 80 mg via EPIDURAL

## 2022-08-11 NOTE — Discharge Instructions (Addendum)

## 2022-08-25 ENCOUNTER — Other Ambulatory Visit (HOSPITAL_COMMUNITY)
Admission: RE | Admit: 2022-08-25 | Discharge: 2022-08-25 | Disposition: A | Discharge status: Home / Self Care | Payer: Medicare Other | Source: Ambulatory Visit | Attending: Gastroenterology | Admitting: Gastroenterology

## 2022-08-25 ENCOUNTER — Telehealth (INDEPENDENT_AMBULATORY_CARE_PROVIDER_SITE_OTHER): Payer: Self-pay

## 2022-08-25 ENCOUNTER — Encounter (INDEPENDENT_AMBULATORY_CARE_PROVIDER_SITE_OTHER): Payer: Self-pay

## 2022-08-25 DIAGNOSIS — I10 Essential (primary) hypertension: Secondary | ICD-10-CM | POA: Diagnosis present

## 2022-08-25 LAB — BASIC METABOLIC PANEL
Anion gap: 7 (ref 5–15)
BUN: 17 mg/dL (ref 8–23)
CO2: 28 mmol/L (ref 22–32)
Calcium: 9.3 mg/dL (ref 8.9–10.3)
Chloride: 104 mmol/L (ref 98–111)
Creatinine, Ser: 0.84 mg/dL (ref 0.44–1.00)
GFR, Estimated: >60 mL/min (ref >60)
Glucose, Bld: 85 mg/dL (ref 70–99)
Potassium: 3.7 mmol/L (ref 3.5–5.1)
Sodium: 139 mmol/L (ref 135–145)

## 2022-08-25 MED ORDER — PEG 3350-KCL-NA BICARB-NACL 420 G PO SOLR: 4000.0000 mL | ORAL | 0 refills | Status: DC

## 2022-08-25 NOTE — Telephone Encounter (Signed)
Zella Dewan Ann Beza Steppe, CMA  ?

## 2022-08-27 ENCOUNTER — Ambulatory Visit (HOSPITAL_COMMUNITY): Payer: Medicare Other | Admitting: Anesthesiology

## 2022-08-27 ENCOUNTER — Ambulatory Visit (HOSPITAL_COMMUNITY)
Admission: RE | Admit: 2022-08-27 | Discharge: 2022-08-27 | Disposition: A | Discharge status: Home / Self Care | Payer: Medicare Other | Source: Ambulatory Visit | Attending: Gastroenterology | Admitting: Gastroenterology

## 2022-08-27 ENCOUNTER — Ambulatory Visit (HOSPITAL_BASED_OUTPATIENT_CLINIC_OR_DEPARTMENT_OTHER): Payer: Medicare Other | Admitting: Anesthesiology

## 2022-08-27 ENCOUNTER — Encounter (HOSPITAL_COMMUNITY)
Admission: RE | Disposition: A | Discharge status: Home / Self Care | Payer: Self-pay | Source: Ambulatory Visit | Attending: Gastroenterology

## 2022-08-27 ENCOUNTER — Other Ambulatory Visit: Payer: Self-pay

## 2022-08-27 ENCOUNTER — Encounter (HOSPITAL_COMMUNITY): Payer: Self-pay | Admitting: Gastroenterology

## 2022-08-27 DIAGNOSIS — E119 Type 2 diabetes mellitus without complications: Secondary | ICD-10-CM | POA: Diagnosis not present

## 2022-08-27 DIAGNOSIS — Z86010 Personal history of colonic polyps: Secondary | ICD-10-CM | POA: Diagnosis not present

## 2022-08-27 DIAGNOSIS — Z1211 Encounter for screening for malignant neoplasm of colon: Secondary | ICD-10-CM | POA: Insufficient documentation

## 2022-08-27 DIAGNOSIS — K219 Gastro-esophageal reflux disease without esophagitis: Secondary | ICD-10-CM | POA: Insufficient documentation

## 2022-08-27 DIAGNOSIS — F419 Anxiety disorder, unspecified: Secondary | ICD-10-CM | POA: Insufficient documentation

## 2022-08-27 DIAGNOSIS — I10 Essential (primary) hypertension: Secondary | ICD-10-CM | POA: Diagnosis not present

## 2022-08-27 DIAGNOSIS — Z09 Encounter for follow-up examination after completed treatment for conditions other than malignant neoplasm: Secondary | ICD-10-CM

## 2022-08-27 DIAGNOSIS — K635 Polyp of colon: Secondary | ICD-10-CM

## 2022-08-27 DIAGNOSIS — D120 Benign neoplasm of cecum: Secondary | ICD-10-CM | POA: Diagnosis not present

## 2022-08-27 DIAGNOSIS — E785 Hyperlipidemia, unspecified: Secondary | ICD-10-CM | POA: Insufficient documentation

## 2022-08-27 DIAGNOSIS — D123 Benign neoplasm of transverse colon: Secondary | ICD-10-CM

## 2022-08-27 DIAGNOSIS — K5140 Inflammatory polyps of colon without complications: Secondary | ICD-10-CM | POA: Diagnosis not present

## 2022-08-27 HISTORY — PX: COLONOSCOPY WITH PROPOFOL: SHX5780

## 2022-08-27 HISTORY — PX: POLYPECTOMY: SHX5525

## 2022-08-27 LAB — HM COLONOSCOPY

## 2022-08-27 LAB — GLUCOSE, CAPILLARY: Glucose-Capillary: 113 mg/dL — ABNORMAL HIGH (ref 70–99)

## 2022-08-27 SURGERY — COLONOSCOPY WITH PROPOFOL
Anesthesia: General

## 2022-08-27 MED ORDER — PROPOFOL 500 MG/50ML IV EMUL
INTRAVENOUS | Status: DC | PRN
  Administered 2022-08-27: 150 ug/kg/min via INTRAVENOUS

## 2022-08-27 MED ORDER — LACTATED RINGERS IV SOLN: INTRAVENOUS | Status: DC | PRN

## 2022-08-27 MED ORDER — LACTATED RINGERS IV SOLN: INTRAVENOUS | Status: DC

## 2022-08-27 MED ORDER — PROPOFOL 10 MG/ML IV BOLUS
INTRAVENOUS | Status: DC | PRN
  Administered 2022-08-27 (×3): 50 mg via INTRAVENOUS

## 2022-08-27 NOTE — H&P (Signed)
Emily Reeves is an 66 y.o. female.   Chief Complaint: history of colon polyps HPI: 66 year old female with past medical history of diabetes, GERD, hyperlipidemia, hypertension, coming for history of colonic polyps.  Last colonoscopy was performed in 2018, had 1 tubular adenoma.  The patient denies having any complaints such as melena, hematochezia, abdominal pain or distention, change in her bowel movement consistency or frequency, no changes in weight recently.  No family history of colorectal cancer.   Past Medical History:  Diagnosis Date   Anxiety    Arthritis    Complication of anesthesia    Diabetes mellitus without complication ([facility])    GERD (gastroesophageal reflux disease)    History of UTI    Hyperlipidemia    Hypertension    Perimenopausal vasomotor symptoms    PONV (postoperative nausea and vomiting)     Past Surgical History:  Procedure Laterality Date   APPENDECTOMY     CESAREAN SECTION     CHOLECYSTECTOMY     COLONOSCOPY N/A 06/18/2017   Procedure: COLONOSCOPY;  Surgeon: Rogene Houston, MD;  [facility];  Service: Endoscopy;  Laterality: N/A;  [place] SURGERY  2011   POLYPECTOMY  06/18/2017   Procedure: POLYPECTOMY;  Surgeon: Rogene Houston, MD;  [facility];  Service: Endoscopy;;  colon   RECTOCELE REPAIR N/A 04/22/2016   Procedure: POSTERIOR REPAIR (RECTOCELE);  Surgeon: Jonnie Kind, MD;  Location: [facility];  Service: Gynecology;  Laterality: N/A;   ROTATOR CUFF REPAIR     TONSILLECTOMY     TOTAL KNEE ARTHROPLASTY Left 07/25/2019   Procedure: LEFT TOTAL KNEE ARTHROPLASTY;  Surgeon: Frederik Pear, MD;  Location: [facility];  Service: Orthopedics;  Laterality: Left;   TUBAL LIGATION      Family History  Problem Relation Age of Onset   Cancer Mother        breast   Heart disease Mother        angina   Hypertension Mother    Heart disease Father        heart attack   Heart disease Maternal Grandfather        heart attack    Diabetes Maternal Grandfather    Hypertension Maternal Aunt    Heart disease Maternal Aunt    Colon cancer Neg Hx    Social History:  reports that she has never smoked. She has never used smokeless tobacco. She reports that she does not drink alcohol and does not use drugs.  Allergies: No Known Allergies  Medications Prior to Admission  Medication Sig Dispense Refill   acetaminophen (TYLENOL) 500 MG tablet Take 500 mg by mouth every 6 (six) hours as needed for moderate pain.     ALPRAZolam (XANAX) 0.5 MG tablet TAKE 1 TABLET BY MOUTH EVERY DAY AS NEEDED FOR ANXIETY (Patient taking differently: Take 0.25 mg by mouth daily as needed for anxiety.) 30 tablet 4   atorvastatin (LIPITOR) 10 MG tablet Take 10 mg by mouth daily.  12   cetirizine (ZYRTEC) 10 MG tablet Take 10 mg by mouth daily as needed for allergies.     cholecalciferol (VITAMIN D3) 25 MCG (1000 UT) tablet Take 1,000 Units by mouth daily.     famotidine (PEPCID) 20 MG tablet Take 20 mg by mouth at bedtime.     hydrochlorothiazide (HYDRODIURIL) 25 MG tablet Take 25 mg by mouth daily.     ketotifen (ZADITOR) 0.035 % ophthalmic solution Place 1 drop into both  eyes 2 (two) times daily as needed (itchy eyes).     KLOR-CON M20 20 MEQ tablet Take 20 mEq by mouth 2 (two) times daily.  12   losartan (COZAAR) 50 MG tablet Take 50 mg by mouth 2 (two) times daily.     metFORMIN (GLUCOPHAGE) 500 MG tablet Take 250-500 mg by mouth See admin instructions. Take 500 mg in the morning and 250 mg in the evening     methotrexate (RHEUMATREX) 2.5 MG tablet Take 17.5 mg by mouth every Friday. Caution:Chemotherapy. Protect from light.     pantoprazole (PROTONIX) 40 MG tablet Take 40 mg by mouth 2 (two) times daily.     polyethylene glycol-electrolytes (TRILYTE) 420 g solution Take 4,000 mLs by mouth as directed. 4000 mL 0   lidocaine (LIDODERM) 5 % Place 1 patch onto the skin daily. Remove & Discard patch within 12 hours or as directed by MD (Patient not  taking: Reported on 08/22/2022) 30 patch 0   predniSONE (DELTASONE) 20 MG tablet Take 2 tablets (40 mg total) by mouth daily. (Patient not taking: Reported on 08/22/2022) 10 tablet 0    Results for orders placed or performed during the hospital encounter of 08/25/22 (from the past 48 hour(s))  Basic Metabolic Panel (BMET)     Status: None   Collection Time: 08/25/22 11:59 AM  Result Value Ref Range   Sodium 139 135 - 145 mmol/L   Potassium 3.7 3.5 - 5.1 mmol/L   Chloride 104 98 - 111 mmol/L   CO2 28 22 - 32 mmol/L   Glucose, Bld 85 70 - 99 mg/dL    Comment: Glucose reference range applies only to samples taken after fasting for at least 8 hours.   BUN 17 8 - 23 mg/dL   Creatinine, Ser 0.84 0.44 - 1.00 mg/dL   Calcium 9.3 8.9 - 10.3 mg/dL   GFR, Estimated >60 >60 mL/min    Comment: (NOTE) Calculated using the CKD-EPI Creatinine Equation (2021)    Anion gap 7 5 - 15    Comment: Performed at [facility], [address], [place], [place] [address]   No results found.  Review of Systems  All other systems reviewed and are negative.   Blood pressure 130/73, pulse 83, temperature 98.5 F (36.9 C), temperature source Oral, resp. rate 12, height '5\' 10"'$  (1.778 m), weight 88.5 kg, SpO2 99 %. Physical Exam  GENERAL: The patient is AO x3, in no acute distress. HEENT: Head is normocephalic and atraumatic. EOMI are intact. Mouth is well hydrated and without lesions. NECK: Supple. No masses LUNGS: Clear to auscultation. No presence of rhonchi/wheezing/rales. Adequate chest expansion HEART: RRR, normal s1 and s2. ABDOMEN: Soft, nontender, no guarding, no peritoneal signs, and nondistended. BS +. No masses. EXTREMITIES: Without any cyanosis, clubbing, rash, lesions or edema. NEUROLOGIC: AOx3, no focal motor deficit. SKIN: no jaundice, no rashes  Assessment/Plan 66 year old female with past medical history of diabetes, GERD, hyperlipidemia, hypertension, coming for history of colonic polyps.   We will proceed with colonoscopy.  Harvel Quale, MD 08/27/2022, 8:27 AM

## 2022-08-27 NOTE — Anesthesia Preprocedure Evaluation (Signed)
Anesthesia Evaluation  Patient identified by MRN, date of birth, ID band Patient awake    Reviewed: Allergy & Precautions, H&P , NPO status , Patient's Chart, lab work & pertinent test results, reviewed documented beta blocker date and time   History of Anesthesia Complications (+) PONV and history of anesthetic complications  Airway Mallampati: II  TM Distance: >3 FB Neck ROM: full    Dental no notable dental hx.    Pulmonary neg pulmonary ROS,    Pulmonary exam normal breath sounds clear to auscultation       Cardiovascular Exercise Tolerance: Good hypertension, negative cardio ROS   Rhythm:regular Rate:Normal     Neuro/Psych PSYCHIATRIC DISORDERS Anxiety negative neurological ROS     GI/Hepatic Neg liver ROS, GERD  Medicated,  Endo/Other  negative endocrine ROSdiabetes, Type 2  Renal/GU negative Renal ROS  negative genitourinary   Musculoskeletal   Abdominal   Peds  Hematology negative hematology ROS (+)   Anesthesia Other Findings   Reproductive/Obstetrics negative OB ROS                             Anesthesia Physical Anesthesia Plan  ASA: 2  Anesthesia Plan: General   Post-op Pain Management:    Induction:   PONV Risk Score and Plan: Propofol infusion  Airway Management Planned:   Additional Equipment:   Intra-op Plan:   Post-operative Plan:   Informed Consent: I have reviewed the patients History and Physical, chart, labs and discussed the procedure including the risks, benefits and alternatives for the proposed anesthesia with the patient or authorized representative who has indicated his/her understanding and acceptance.     Dental Advisory Given  Plan Discussed with: CRNA  Anesthesia Plan Comments:         Anesthesia Quick Evaluation

## 2022-08-27 NOTE — Op Note (Addendum)
[facility] Patient Name: Emily Reeves Procedure Date: 08/27/2022 9:09 AM MRN: [id number] Date of Birth: [birth year] Attending MD: Maylon Peppers , , [id number] CSN: [id number] Age: 66 Admit Type: Outpatient Procedure:                Colonoscopy Indications:              Surveillance: Personal history of adenomatous                            polyps on last colonoscopy 5 years ago Providers:                Maylon Peppers, [place] Theda Sers RN, RN, [place] Gene Technician, [employer] officer Referring MD:              Medicines:                Monitored Anesthesia Care Complications:            No immediate complications. Estimated Blood Loss:     Estimated blood loss: none. Procedure:                Pre-Anesthesia Assessment:                           - Prior to the procedure, a History and Physical                            was performed, and patient medications, allergies                            and sensitivities were reviewed. The patient's                            tolerance of previous anesthesia was reviewed.                           - The risks and benefits of the procedure and the                            sedation options and risks were discussed with the                            patient. All questions were answered and informed                            consent was obtained.                           - ASA Grade Assessment: II - A patient with mild                            systemic disease.                           After obtaining informed consent, the colonoscope  was passed under direct vision. Throughout the                            procedure, the patient's blood pressure, pulse, and                            oxygen saturations were monitored continuously. The                            [id number]-HQ190L ([id number]) scope was introduced through                            the anus and advanced to the the cecum,  identified                            by appendiceal orifice and ileocecal valve. The                            colonoscopy was performed without difficulty. The                            patient tolerated the procedure well. The quality                            of the bowel preparation was adequate. Scope In: 9:24:33 AM Scope Out: 9:55:33 AM Scope Withdrawal Time: 0 hours 15 minutes 36 seconds  Total Procedure Duration: 0 hours 31 minutes 0 seconds  Findings:      The perianal and digital rectal examinations were normal.      A 1 mm polyp was found in the cecum. The polyp was sessile. The polyp       was removed with a cold biopsy forceps. Resection and retrieval were       complete.      Two sessile polyps were found in the transverse colon. The polyps were 3       to 5 mm in size. These polyps were removed with a cold snare. Resection       and retrieval were complete.      The retroflexed view of the distal rectum and anal verge was normal and       showed no anal or rectal abnormalities. Impression:               - One 1 mm polyp in the cecum, removed with a cold                            biopsy forceps. Resected and retrieved.                           - Two 3 to 5 mm polyps in the transverse colon,                            removed with a cold snare. Resected and retrieved.                           -  The distal rectum and anal verge are normal on                            retroflexion view. Moderate Sedation:      Per Anesthesia Care Recommendation:           - Discharge patient to home (ambulatory).                           - Resume previous diet.                           - Await pathology results.                           - Repeat colonoscopy in 5 years for surveillance. Procedure Code(s):        --- Professional ---                           [phone], Colonoscopy, flexible; with removal of                            tumor(s), polyp(s), or other lesion(s) by snare                             technique                           45380, 45, Colonoscopy, flexible; with biopsy,                            single or multiple Diagnosis Code(s):        --- Professional ---                           Z86.010, Personal history of colonic polyps                           D12.0, Benign neoplasm of cecum                           D12.3, Benign neoplasm of transverse colon (hepatic                            flexure or splenic flexure) CPT copyright 2022 American Medical Association. All rights reserved. The codes documented in this report are preliminary and upon coder review may  be revised to meet current compliance requirements. Maylon Peppers, MD Maylon Peppers,  08/27/2022 9:59:45 AM This report has been signed electronically. Number of Addenda: 0

## 2022-08-27 NOTE — Discharge Instructions (Signed)
You are being discharged to home.  Resume your previous diet.  We are waiting for your pathology results.  Your physician has recommended a repeat colonoscopy in five years for surveillance.  

## 2022-08-27 NOTE — Transfer of Care (Signed)
Immediate Anesthesia Transfer of Care Note  Patient: Emily Reeves  Procedure(s) Performed: COLONOSCOPY WITH PROPOFOL POLYPECTOMY  Patient Location: Endoscopy Unit  Anesthesia Type:General  Level of Consciousness: awake  Airway & Oxygen Therapy: Patient Spontanous Breathing  Post-op Assessment: Report given to RN  Post vital signs: Reviewed and stable  Last Vitals:  Vitals Value Taken Time  BP 113/71 08/27/22 1002  Temp 36.4 C 08/27/22 1002  Pulse 88 08/27/22 1002  Resp 16 08/27/22 1002  SpO2 100 % 08/27/22 1002    Last Pain:  Vitals:   08/27/22 1002  TempSrc: Oral  PainSc: 0-No pain      Patients Stated Pain Goal: 6 (73/75/05 1071)  Complications: No notable events documented.

## 2022-08-27 NOTE — Anesthesia Postprocedure Evaluation (Signed)
Anesthesia Post Note  Patient: Emily Reeves  Procedure(s) Performed: COLONOSCOPY WITH PROPOFOL POLYPECTOMY  Patient location during evaluation: Endoscopy Anesthesia Type: General Level of consciousness: awake and alert Pain management: pain level controlled Vital Signs Assessment: post-procedure vital signs reviewed and stable Respiratory status: spontaneous breathing Cardiovascular status: blood pressure returned to baseline and stable Postop Assessment: no apparent nausea or vomiting Anesthetic complications: no   No notable events documented.   Last Vitals:  Vitals:   08/27/22 0812 08/27/22 1002  BP: 130/73 113/71  Pulse: 83 88  Resp: 12 16  Temp: 36.9 C (!) 36.4 C  SpO2: 99% 100%    Last Pain:  Vitals:   08/27/22 1002  TempSrc: Oral  PainSc: 0-No pain                 Zuleima Haser

## 2022-08-28 ENCOUNTER — Encounter (INDEPENDENT_AMBULATORY_CARE_PROVIDER_SITE_OTHER): Payer: Self-pay | Admitting: *Deleted

## 2022-08-28 LAB — SURGICAL PATHOLOGY

## 2022-09-01 ENCOUNTER — Encounter (HOSPITAL_COMMUNITY): Payer: Self-pay | Admitting: Gastroenterology

## 2022-12-25 ENCOUNTER — Encounter: Payer: Self-pay | Admitting: Obstetrics & Gynecology

## 2022-12-25 ENCOUNTER — Ambulatory Visit (INDEPENDENT_AMBULATORY_CARE_PROVIDER_SITE_OTHER): Payer: Medicare Other | Admitting: Obstetrics & Gynecology

## 2022-12-25 ENCOUNTER — Other Ambulatory Visit (HOSPITAL_COMMUNITY)
Admission: RE | Admit: 2022-12-25 | Discharge: 2022-12-25 | Disposition: A | Discharge status: Home / Self Care | Payer: Medicare Other | Source: Ambulatory Visit | Attending: Obstetrics & Gynecology | Admitting: Obstetrics & Gynecology

## 2022-12-25 VITALS — BP 117/73 | HR 89 | Ht 70.0 in | Wt 198.0 lb

## 2022-12-25 DIAGNOSIS — Z01419 Encounter for gynecological examination (general) (routine) without abnormal findings: Secondary | ICD-10-CM | POA: Insufficient documentation

## 2022-12-25 DIAGNOSIS — Z1211 Encounter for screening for malignant neoplasm of colon: Secondary | ICD-10-CM

## 2022-12-25 DIAGNOSIS — Z1212 Encounter for screening for malignant neoplasm of rectum: Secondary | ICD-10-CM | POA: Diagnosis not present

## 2022-12-25 DIAGNOSIS — Z1151 Encounter for screening for human papillomavirus (HPV): Secondary | ICD-10-CM | POA: Diagnosis not present

## 2022-12-25 LAB — HEMOCCULT GUIAC POC 1CARD (OFFICE): Fecal Occult Blood, POC: NEGATIVE

## 2022-12-25 NOTE — Addendum Note (Signed)
Addended by: Octaviano Glow on: 12/25/2022 12:40 PM   Modules accepted: Orders

## 2022-12-25 NOTE — Progress Notes (Signed)
Subjective:     Emily Reeves is a 67 y.o. female here for a routine exam.  No LMP recorded. Patient is postmenopausal. EI:[id number] Birth Control Method:  menopausal Menstrual Calendar(currently): amenorrheic  Current complaints: occasional fecal smearing.   Current acute medical issues:  diabetes   Recent Gynecologic History No LMP recorded. Patient is postmenopausal. Last Pap: ,   Last mammogram: normal 5/23,    Past Medical History:  Diagnosis Date   Anxiety    Arthritis    Complication of anesthesia    Diabetes mellitus without complication ([place])    GERD (gastroesophageal reflux disease)    History of UTI    Hyperlipidemia    Hypertension    Perimenopausal vasomotor symptoms    PONV (postoperative nausea and vomiting)     Past Surgical History:  Procedure Laterality Date   APPENDECTOMY     CESAREAN SECTION     CHOLECYSTECTOMY     COLONOSCOPY N/A 06/18/2017   Procedure: COLONOSCOPY;  Surgeon: Rogene Houston, MD;  Location: [facility];  Service: Endoscopy;  Laterality: N/A;  830   COLONOSCOPY WITH PROPOFOL N/A 08/27/2022   Procedure: COLONOSCOPY WITH PROPOFOL;  Surgeon: Harvel Quale, MD;  Location: [facility];  Service: Gastroenterology;  Laterality: N/A;  915 ASA 2   FOOT SURGERY  2011   POLYPECTOMY  06/18/2017   Procedure: POLYPECTOMY;  Surgeon: Rogene Houston, MD;  Location: [facility];  Service: Endoscopy;;  colon   POLYPECTOMY  08/27/2022   Procedure: POLYPECTOMY;  Surgeon: Harvel Quale, MD;  Location: [facility];  Service: Gastroenterology;;   RECTOCELE REPAIR N/A 04/22/2016   Procedure: POSTERIOR REPAIR (RECTOCELE);  Surgeon: Jonnie Kind, MD;  Location: [facility];  Service: Gynecology;  Laterality: N/A;   ROTATOR CUFF REPAIR     TONSILLECTOMY     TOTAL KNEE ARTHROPLASTY Left 07/25/2019   Procedure: LEFT TOTAL KNEE ARTHROPLASTY;  Surgeon: Frederik Pear, MD;  Location: [facility];  Service: Orthopedics;  Laterality: Left;   TUBAL  LIGATION      OB History     Gravida  3   Para  3   Term  3   Preterm      AB      Living  3      SAB      IAB      Ectopic      Multiple      Live Births  3           Social History   Socioeconomic History   Marital status: Married    Spouse name: Not on file   Number of children: Not on file   Years of education: Not on file   Highest education level: Not on file  Occupational History   Not on file  Tobacco Use   Smoking status: Never   Smokeless tobacco: Never  Vaping Use   Vaping Use: Never used  Substance and Sexual Activity   Alcohol use: No   Drug use: No   Sexual activity: Yes    Birth control/protection: Post-menopausal, Surgical  Other Topics Concern   Not on file  Social History Narrative   Not on file   Social Determinants of Health   Financial Resource Strain: Low Risk  (12/25/2022)   Overall Financial Resource Strain (CARDIA)    Difficulty of Paying Living Expenses: Not hard at all  Food Insecurity: No Food Insecurity (12/25/2022)   Hunger Vital Sign  Worried About Charity fundraiser in the Last Year: Never true    [place] in the Last Year: Never true  Transportation Needs: No Transportation Needs (12/25/2022)   PRAPARE - Hydrologist (Medical): No    Lack of Transportation (Non-Medical): No  Physical Activity: Inactive (12/25/2022)   Exercise Vital Sign    Days of Exercise per Week: 0 days    Minutes of Exercise per Session: 0 min  Stress: No Stress Concern Present (12/25/2022)   [place]    Feeling of Stress : Only a little  Social Connections: Socially Integrated (12/25/2022)   Social Connection and Isolation Panel [NHANES]    Frequency of Communication with Friends and Family: More than three times a week    Frequency of Social Gatherings with Friends and Family: More than three times a week    Attends Religious  Services: More than 4 times per year    Active Member of [employer] or Organizations: Yes    Attends Music therapist: More than 4 times per year    Marital Status: Married    Family History  Problem Relation Age of Onset   Cancer Mother        breast   Heart disease Mother        angina   Hypertension Mother    Heart disease Father        heart attack   Heart disease Maternal Grandfather        heart attack   Diabetes Maternal Grandfather    Hypertension Maternal Aunt    Heart disease Maternal Aunt    Colon cancer Neg Hx      Current Outpatient Medications:    acetaminophen (TYLENOL) 500 MG tablet, Take 500 mg by mouth every 6 (six) hours as needed for moderate pain., Disp: , Rfl:    ALPRAZolam (XANAX) 0.5 MG tablet, TAKE 1 TABLET BY MOUTH EVERY DAY AS NEEDED FOR ANXIETY (Patient taking differently: Take 0.25 mg by mouth daily as needed for anxiety.), Disp: 30 tablet, Rfl: 4   atorvastatin (LIPITOR) 10 MG tablet, Take 10 mg by mouth daily., Disp: , Rfl: 12   cetirizine (ZYRTEC) 10 MG tablet, Take 10 mg by mouth daily as needed for allergies., Disp: , Rfl:    cholecalciferol (VITAMIN D3) 25 MCG (1000 UT) tablet, Take 1,000 Units by mouth daily., Disp: , Rfl:    famotidine (PEPCID) 20 MG tablet, Take 20 mg by mouth at bedtime., Disp: , Rfl:    folic acid (FOLVITE) 1 MG tablet, Take 1 mg by mouth daily., Disp: , Rfl:    hydrochlorothiazide (HYDRODIURIL) 25 MG tablet, Take 25 mg by mouth daily., Disp: , Rfl:    ketotifen (ZADITOR) 0.035 % ophthalmic solution, Place 1 drop into both eyes 2 (two) times daily as needed (itchy eyes)., Disp: , Rfl:    KLOR-CON M20 20 MEQ tablet, Take 20 mEq by mouth 2 (two) times daily., Disp: , Rfl: 12   losartan (COZAAR) 50 MG tablet, Take 50 mg by mouth 2 (two) times daily., Disp: , Rfl:    meclizine (ANTIVERT) 25 MG tablet, Take 25 mg by mouth 3 (three) times daily as needed., Disp: , Rfl:    metFORMIN (GLUCOPHAGE) 500 MG tablet, Take 500 mg  by mouth daily with breakfast. Take 500 mg in the morning and 250 mg in the evening, Disp: , Rfl:    methotrexate (  RHEUMATREX) 2.5 MG tablet, Take 17.5 mg by mouth every Friday. Caution:Chemotherapy. Protect from light., Disp: , Rfl:    pantoprazole (PROTONIX) 40 MG tablet, Take 40 mg by mouth 2 (two) times daily., Disp: , Rfl:    lidocaine (LIDODERM) 5 %, Place 1 patch onto the skin daily. Remove & Discard patch within 12 hours or as directed by MD (Patient not taking: Reported on 08/22/2022), Disp: 30 patch, Rfl: 0  Review of Systems  Review of Systems  Constitutional: Negative for fever, chills, weight loss, malaise/fatigue and diaphoresis.  HENT: Negative for hearing loss, ear pain, nosebleeds, congestion, sore throat, neck pain, tinnitus and ear discharge.   Eyes: Negative for blurred vision, double vision, photophobia, pain, discharge and redness.  Respiratory: Negative for cough, hemoptysis, sputum production, shortness of breath, wheezing and stridor.   Cardiovascular: Negative for chest pain, palpitations, orthopnea, claudication, leg swelling and PND.  Gastrointestinal: negative for abdominal pain. Negative for heartburn, nausea, vomiting, diarrhea, constipation, blood in stool and melena.  Genitourinary: Negative for dysuria, urgency, frequency, hematuria and flank pain.  Musculoskeletal: Negative for myalgias, back pain, joint pain and falls.  Skin: Negative for itching and rash.  Neurological: Negative for dizziness, tingling, tremors, sensory change, speech change, focal weakness, seizures, loss of consciousness, weakness and headaches.  Endo/Heme/Allergies: Negative for environmental allergies and polydipsia. Does not bruise/bleed easily.  Psychiatric/Behavioral: Negative for depression, suicidal ideas, hallucinations, memory loss and substance abuse. The patient is not nervous/anxious and does not have insomnia.        Objective:  Blood pressure 117/73, pulse 89, height 5' 10"$   (1.778 m), weight 198 lb (89.8 kg).   Physical Exam  Vitals reviewed. Constitutional: She is oriented to person, place, and time. She appears well-developed and well-nourished.  HENT:  Head: Normocephalic and atraumatic.        Right Ear: External ear normal.  Left Ear: External ear normal.  Nose: Nose normal.  Mouth/Throat: Oropharynx is clear and moist.  Eyes: Conjunctivae and EOM are normal. Pupils are equal, round, and reactive to light. Right eye exhibits no discharge. Left eye exhibits no discharge. No scleral icterus.  Neck: Normal range of motion. Neck supple. No tracheal deviation present. No thyromegaly present.  Cardiovascular: Normal rate, regular rhythm, normal heart sounds and intact distal pulses.  Exam reveals no gallop and no friction rub.   No murmur heard. Respiratory: Effort normal and breath sounds normal. No respiratory distress. She has no wheezes. She has no rales. She exhibits no tenderness.  GI: Soft. Bowel sounds are normal. She exhibits no distension and no mass. There is no tenderness. There is no rebound and no guarding.  Genitourinary:  Breasts no masses skin changes or nipple changes bilaterally      Vulva is normal without lesions Vagina is pink moist without discharge Cervix normal in appearance and pap is done Uterus is normal size shape and contour Adnexa is negative with normal sized ovaries  {Rectal    hemoccult negative, normal tone, no masses  Musculoskeletal: Normal range of motion. She exhibits no edema and no tenderness.  Neurological: She is alert and oriented to person, place, and time. She has normal reflexes. She displays normal reflexes. No cranial nerve deficit. She exhibits normal muscle tone. Coordination normal.  Skin: Skin is warm and dry. No rash noted. No erythema. No pallor.  Psychiatric: She has a normal mood and affect. Her behavior is normal. Judgment and thought content normal.       Medications Ordered  at today's visit: No  orders of the defined types were placed in this encounter.   Other orders placed at today's visit: No orders of the defined types were placed in this encounter.     Assessment:    Normal Gyn exam.    Plan:    Normal exam No rectocoele Recommending adding fiber to her diet     Return in about 3 years (around 12/25/2025).

## 2022-12-26 LAB — CYTOLOGY - PAP
Adequacy: ABSENT
Comment: NEGATIVE
Diagnosis: NEGATIVE
High risk HPV: NEGATIVE

## 2023-01-01 ENCOUNTER — Other Ambulatory Visit: Payer: Self-pay | Admitting: Obstetrics & Gynecology

## 2023-03-16 ENCOUNTER — Other Ambulatory Visit (HOSPITAL_COMMUNITY): Payer: Self-pay | Admitting: Internal Medicine

## 2023-03-16 DIAGNOSIS — Z1231 Encounter for screening mammogram for malignant neoplasm of breast: Secondary | ICD-10-CM

## 2023-03-25 ENCOUNTER — Ambulatory Visit (HOSPITAL_COMMUNITY)
Admission: RE | Admit: 2023-03-25 | Discharge: 2023-03-25 | Disposition: A | Discharge status: Home / Self Care | Payer: Medicare Other | Source: Ambulatory Visit | Attending: Internal Medicine | Admitting: Internal Medicine

## 2023-03-25 DIAGNOSIS — Z1231 Encounter for screening mammogram for malignant neoplasm of breast: Secondary | ICD-10-CM | POA: Diagnosis present

## 2023-08-01 ENCOUNTER — Other Ambulatory Visit: Payer: Self-pay | Admitting: Obstetrics & Gynecology

## 2023-09-13 IMAGING — MG MM DIGITAL SCREENING BILAT W/ TOMO AND CAD
6 of 10 series · 6 of 30 positions shown · non-contrast
Comparison: Previous exam(s).

CLINICAL DATA: Screening.

EXAM:
DIGITAL SCREENING BILATERAL MAMMOGRAM WITH TOMOSYNTHESIS AND CAD
TECHNIQUE: Bilateral screening digital craniocaudal and mediolateral oblique
mammograms were obtained. Bilateral screening digital breast
tomosynthesis was performed. The images were evaluated with
computer-aided detection.

[L MLO synth-2D (1 of 2)]
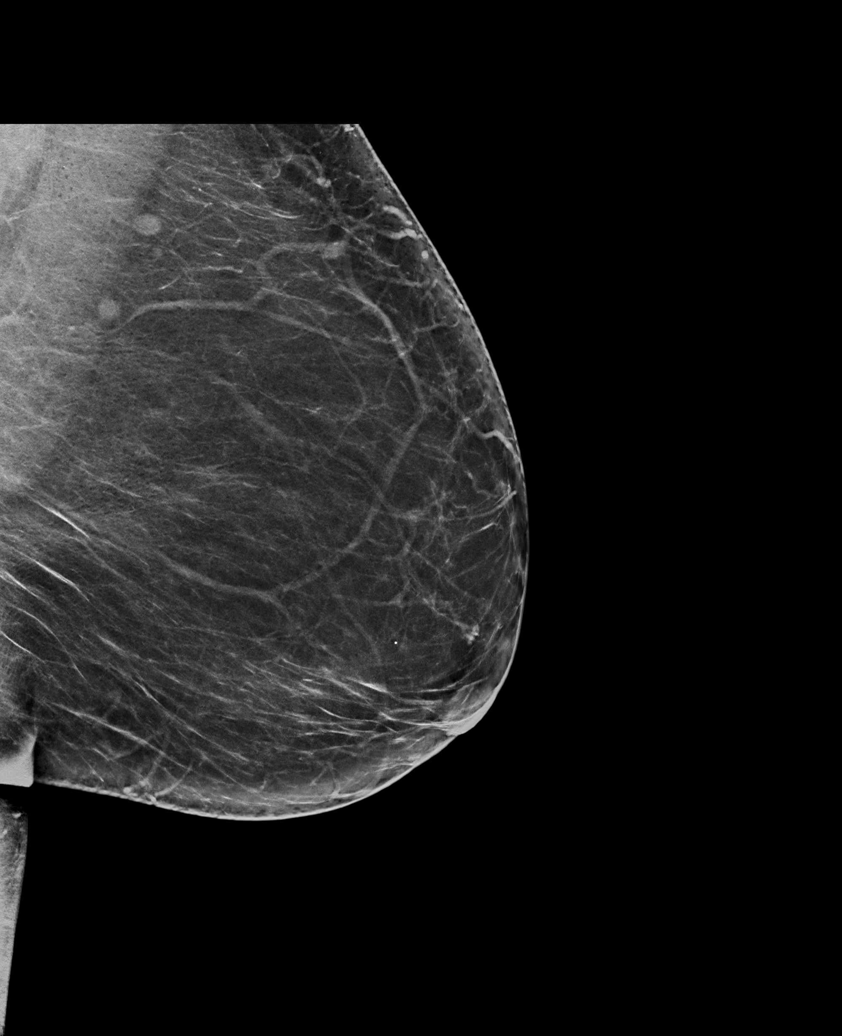

[L MLO synth-2D (2 of 2)]
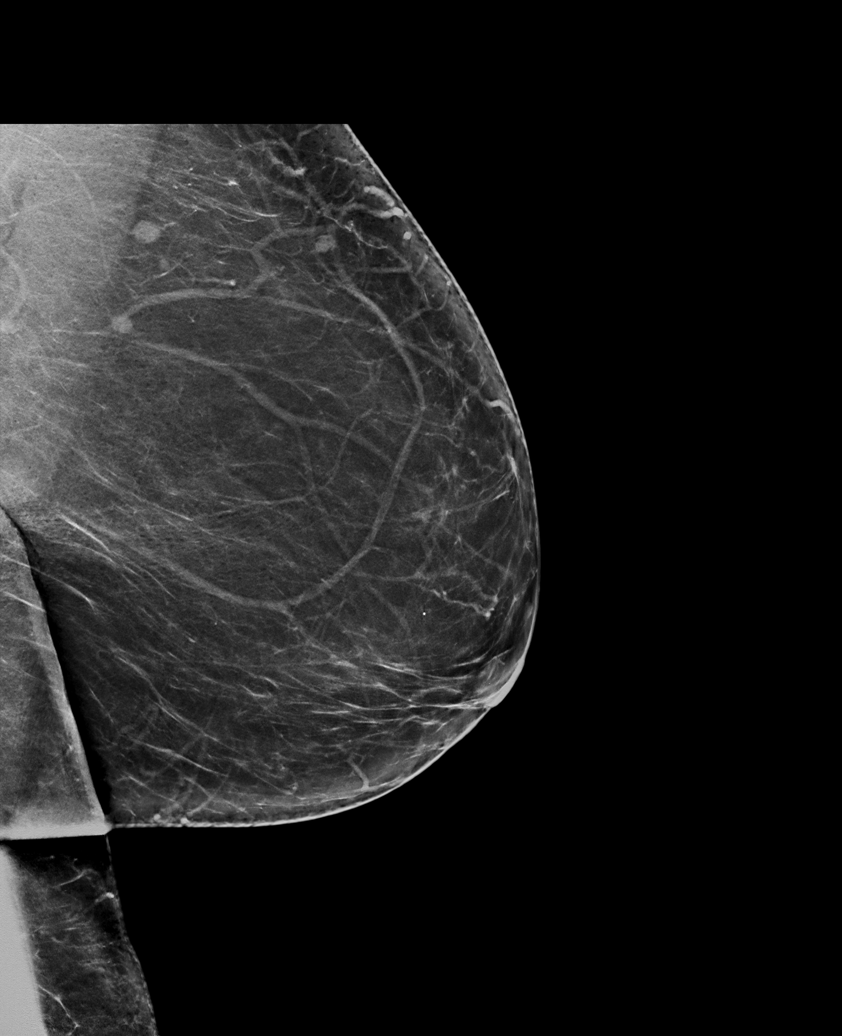

[R CC synth-2D]
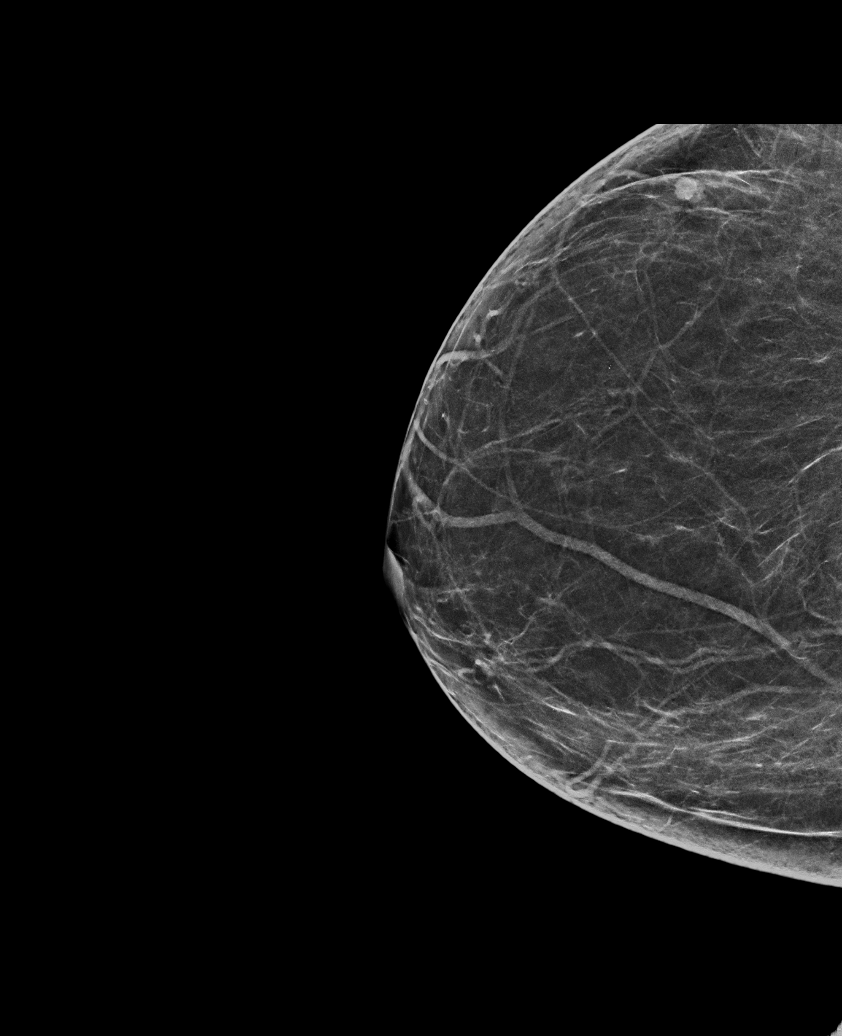

[R MLO synth-2D]
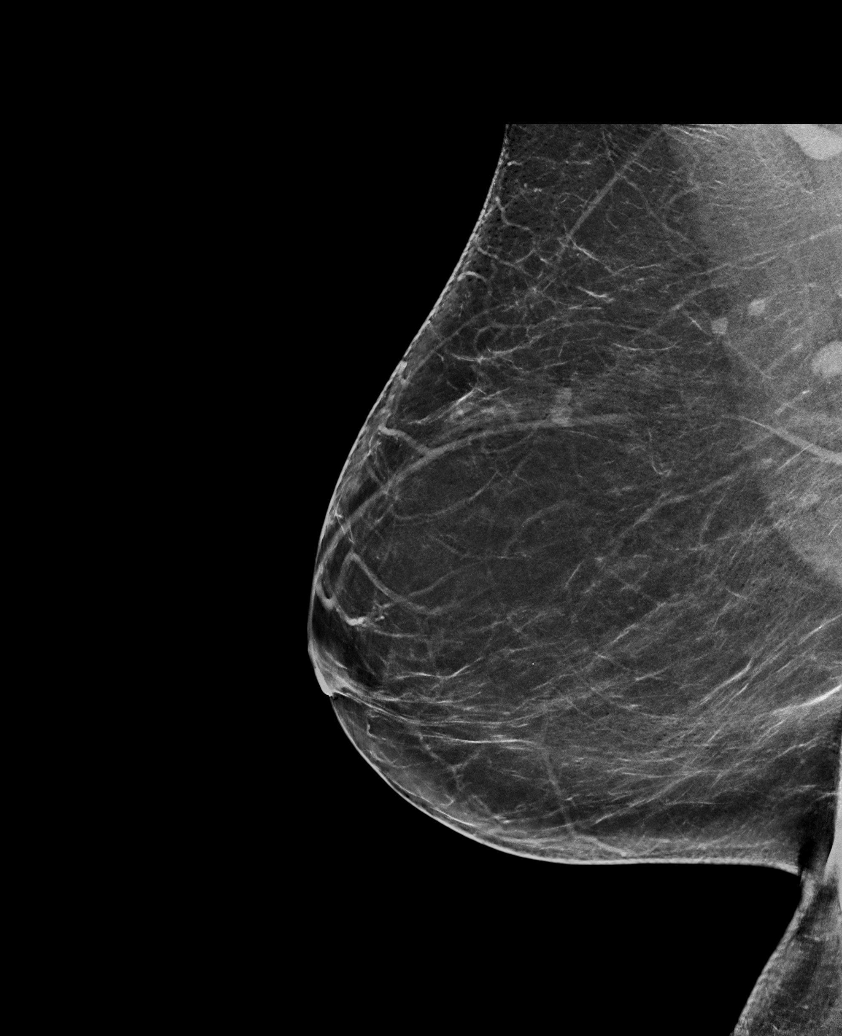

[L CC synth-2D]
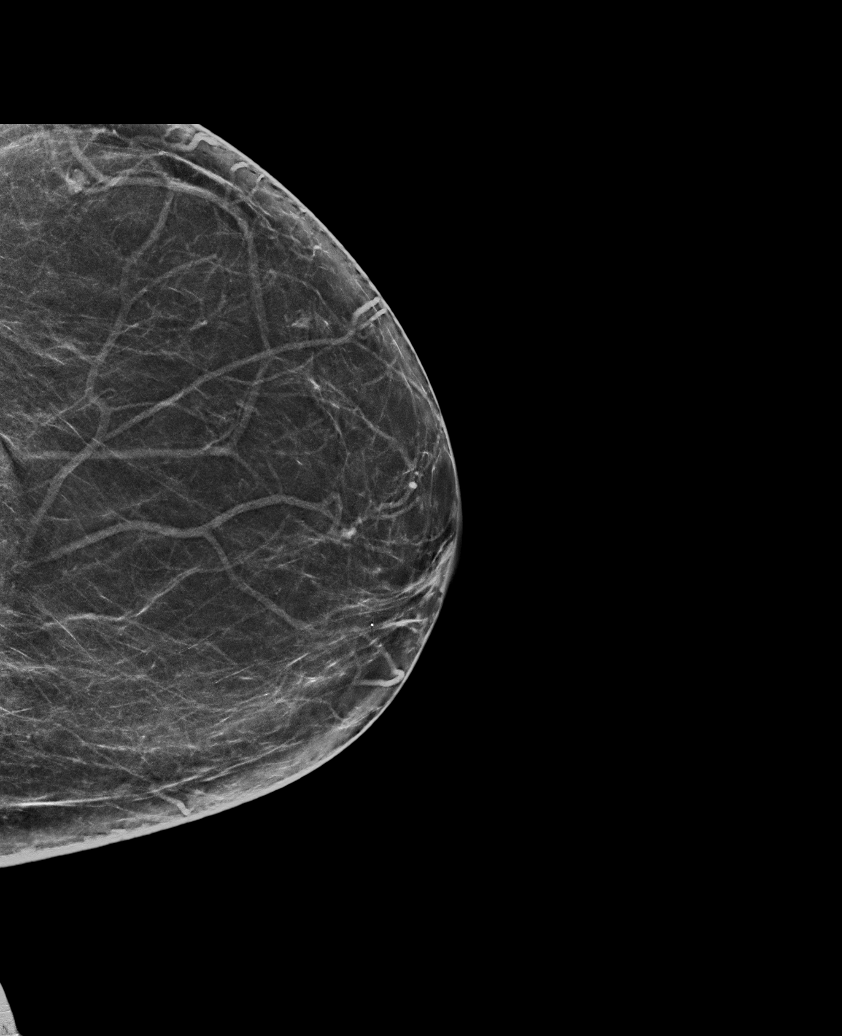

[L MLO tomo · tomo slice 42/83.0]
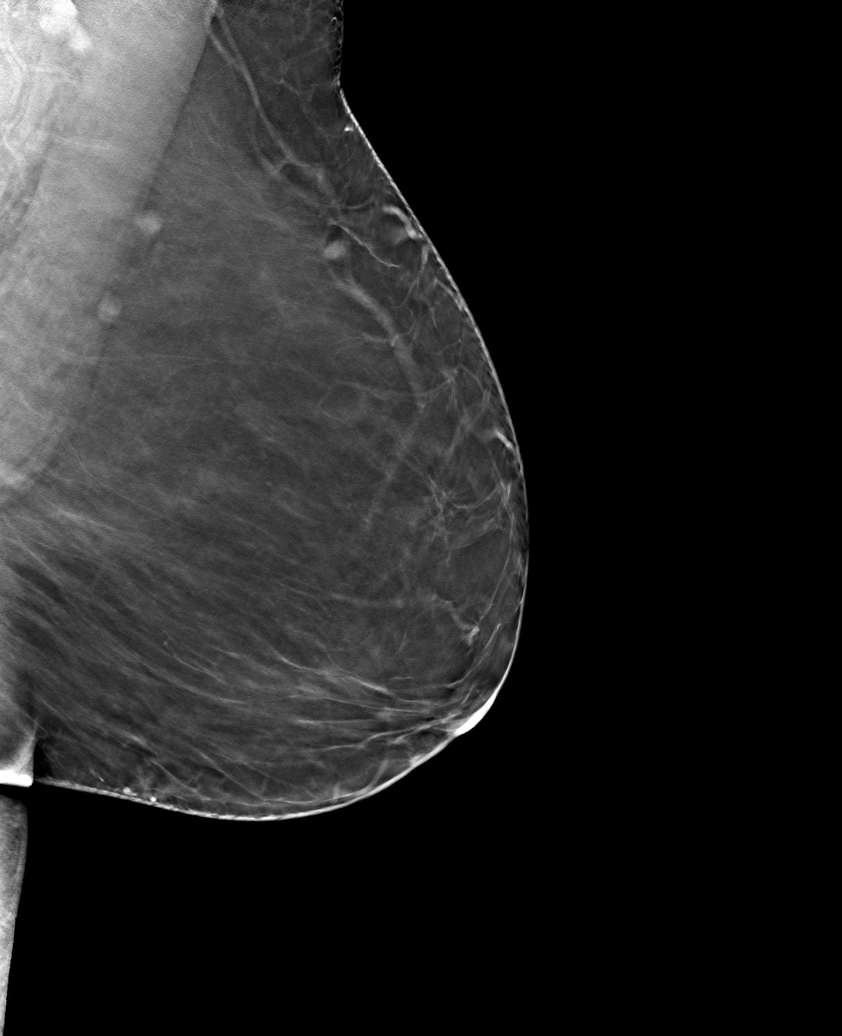

[6 of 30 positions shown; findings below may reference images not displayed]

ACR Breast Density Category b: There are scattered areas of
fibroglandular density.
FINDINGS: There are no findings suspicious for malignancy.
IMPRESSION: No mammographic evidence of malignancy. A result letter of this
screening mammogram will be mailed directly to the patient.

RECOMMENDATION:
Screening mammogram in one year. (Code:51-O-LD2)

BI-RADS CATEGORY  1: Negative.

## 2024-02-25 ENCOUNTER — Other Ambulatory Visit: Payer: Self-pay | Admitting: Internal Medicine

## 2024-02-25 ENCOUNTER — Other Ambulatory Visit (HOSPITAL_COMMUNITY): Payer: Self-pay | Admitting: Internal Medicine

## 2024-02-25 DIAGNOSIS — Z1231 Encounter for screening mammogram for malignant neoplasm of breast: Secondary | ICD-10-CM

## 2024-03-31 ENCOUNTER — Ambulatory Visit (HOSPITAL_COMMUNITY)

## 2024-04-06 ENCOUNTER — Other Ambulatory Visit: Payer: Self-pay | Admitting: Obstetrics & Gynecology

## 2024-04-08 ENCOUNTER — Ambulatory Visit (HOSPITAL_COMMUNITY)
Admission: RE | Admit: 2024-04-08 | Discharge: 2024-04-08 | Disposition: A | Discharge status: Home / Self Care | Source: Ambulatory Visit | Attending: Internal Medicine | Admitting: Internal Medicine

## 2024-04-08 ENCOUNTER — Encounter (HOSPITAL_COMMUNITY): Payer: Self-pay

## 2024-04-08 DIAGNOSIS — Z1231 Encounter for screening mammogram for malignant neoplasm of breast: Secondary | ICD-10-CM | POA: Diagnosis present

## 2024-04-12 ENCOUNTER — Telehealth: Payer: Self-pay

## 2024-04-12 ENCOUNTER — Other Ambulatory Visit: Payer: Self-pay | Admitting: Obstetrics & Gynecology

## 2024-04-12 MED ORDER — ALPRAZOLAM 0.5 MG PO TABS: ORAL_TABLET | ORAL | 1 refills | Status: AC

## 2024-04-12 NOTE — Telephone Encounter (Signed)
 Patient would like a refill on her XANAX .

## 2025-01-19 ENCOUNTER — Ambulatory Visit: Admitting: Obstetrics & Gynecology
